# Patient Record
Sex: Female | Born: 2000 | Race: Black or African American | Hispanic: No | Marital: Single
Health system: Southern US, Community
[De-identification: ages and names within clinical notes are randomized; demographics above are authoritative.]

## PROBLEM LIST (undated history)

## (undated) DIAGNOSIS — F329 Major depressive disorder, single episode, unspecified: Secondary | ICD-10-CM

## (undated) DIAGNOSIS — F419 Anxiety disorder, unspecified: Secondary | ICD-10-CM

## (undated) DIAGNOSIS — F6089 Other specific personality disorders: Secondary | ICD-10-CM

## (undated) DIAGNOSIS — F3281 Premenstrual dysphoric disorder: Secondary | ICD-10-CM

## (undated) DIAGNOSIS — H539 Unspecified visual disturbance: Secondary | ICD-10-CM

## (undated) DIAGNOSIS — D649 Anemia, unspecified: Secondary | ICD-10-CM

---
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
## 2020-04-07 DIAGNOSIS — D500 Iron deficiency anemia secondary to blood loss (chronic): Secondary | ICD-10-CM | POA: Insufficient documentation

## 2020-04-07 DIAGNOSIS — R002 Palpitations: Secondary | ICD-10-CM | POA: Insufficient documentation

## 2020-04-21 ENCOUNTER — Other Ambulatory Visit: Payer: Self-pay | Admitting: Certified Nurse Midwife

## 2020-04-21 DIAGNOSIS — N938 Other specified abnormal uterine and vaginal bleeding: Secondary | ICD-10-CM

## 2020-05-01 ENCOUNTER — Other Ambulatory Visit: Payer: Self-pay | Admitting: Advanced Practice Midwife

## 2020-05-01 DIAGNOSIS — N938 Other specified abnormal uterine and vaginal bleeding: Secondary | ICD-10-CM

## 2020-05-07 ENCOUNTER — Ambulatory Visit
Admission: RE | Admit: 2020-05-07 | Discharge: 2020-05-07 | Disposition: A | Discharge status: Home / Self Care | Payer: Medicaid Other | Source: Ambulatory Visit | Attending: Certified Nurse Midwife | Admitting: Certified Nurse Midwife

## 2020-05-07 DIAGNOSIS — N938 Other specified abnormal uterine and vaginal bleeding: Secondary | ICD-10-CM

## 2021-04-28 ENCOUNTER — Institutional Professional Consult (permissible substitution): Payer: Self-pay | Admitting: Internal Medicine

## 2021-07-28 ENCOUNTER — Ambulatory Visit (INDEPENDENT_AMBULATORY_CARE_PROVIDER_SITE_OTHER): Payer: BC Managed Care – PPO | Admitting: Podiatry

## 2021-07-28 ENCOUNTER — Encounter: Payer: Self-pay | Admitting: Podiatry

## 2021-07-28 ENCOUNTER — Other Ambulatory Visit: Payer: Self-pay

## 2021-07-28 DIAGNOSIS — L600 Ingrowing nail: Secondary | ICD-10-CM

## 2021-07-28 NOTE — Patient Instructions (Signed)

## 2021-07-28 NOTE — Progress Notes (Signed)
Subjective:   Patient ID: Tammy Petersen, female   DOB: 20 y.o.   MRN: [id number]   HPI Patient has had chronic ingrown toenails of both feet for a long time and tries to trim them out herself and states that it is no longer effective.  States that she soaks she had the left one worked on but it still giving her trouble and the right one has been very tender recently.  Patient has been on antibiotics which helped the drainage and redness and now it is just pain.  Patient does not smoke likes to be active   Review of Systems  All other systems reviewed and are negative.      Objective:  Physical Exam Vitals and nursing note reviewed.  Constitutional:      Appearance: She is well-developed.  Pulmonary:     Effort: Pulmonary effort is normal.  Musculoskeletal:        General: Normal range of motion.  Skin:    General: Skin is warm.  Neurological:     Mental Status: She is alert.    Neurovascular status intact muscle strength found to be adequate range of motion within normal limits.  Patient is found to have incurvated hallux nails right and left medial border with quite a bit of pain right over left.  There is deformity of the nailbeds and the left 1 is more proximal but quite deformed in its nature.  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  She signed consent form and today I infiltrated each hallux 60 mg like Marcaine mixture sterile prep done and using sterile instrumentation I remove the medial borders of the hallux bilateral exposed matrix applied phenol 3 applications 30 seconds followed by alcohol lavage sterile dressing gave instructions on soaks and encouraged her to call with questions concerns and to leave dressings on 24  hours but take them off earlier if any throbbing were to occur

## 2022-02-24 ENCOUNTER — Other Ambulatory Visit: Payer: Self-pay

## 2022-02-24 ENCOUNTER — Emergency Department (HOSPITAL_COMMUNITY)
Admission: EM | Admit: 2022-02-24 | Discharge: 2022-02-24 | Disposition: A | Discharge status: Home / Self Care | Payer: BC Managed Care – PPO | Attending: Emergency Medicine | Admitting: Emergency Medicine

## 2022-02-24 ENCOUNTER — Encounter (HOSPITAL_COMMUNITY): Payer: Self-pay | Admitting: Emergency Medicine

## 2022-02-24 ENCOUNTER — Emergency Department (HOSPITAL_COMMUNITY): Payer: BC Managed Care – PPO

## 2022-02-24 DIAGNOSIS — M79644 Pain in right finger(s): Secondary | ICD-10-CM | POA: Insufficient documentation

## 2022-02-24 DIAGNOSIS — S92424A Nondisplaced fracture of distal phalanx of right great toe, initial encounter for closed fracture: Secondary | ICD-10-CM | POA: Insufficient documentation

## 2022-02-24 DIAGNOSIS — W208XXA Other cause of strike by thrown, projected or falling object, initial encounter: Secondary | ICD-10-CM | POA: Insufficient documentation

## 2022-02-24 DIAGNOSIS — S99921A Unspecified injury of right foot, initial encounter: Secondary | ICD-10-CM | POA: Diagnosis present

## 2022-02-24 NOTE — ED Notes (Signed)
Patient complaining of right foot pain on top of foot. Patient foot has no bruising but a little swelling on the area right before her big toe. ?

## 2022-02-24 NOTE — Discharge Instructions (Signed)
You were seen in the emergency department for evaluation of toe pain after a crush injury.  The x-ray showed a possible avulsion fracture.  You should use ice to the affected area and [place] shoe for comfort.  Ibuprofen for pain.  Follow-up with your regular doctor if any worsening or concerning symptoms. ?

## 2022-02-24 NOTE — ED Triage Notes (Signed)
Patient is complaining of dropping some kind of board on the top of her right foot. Patient complaining of pain and throbbing. ?

## 2022-02-24 NOTE — ED Provider Notes (Signed)
?[facility] DEPT ?Provider Note ? ? ?CSN: [id number] ?Arrival date & time: 02/24/22  2123 ? ?  ? ?History ? ?Chief Complaint  ?Patient presents with  ? Toe Injury  ? ? ?Tammy Petersen is a 21 y.o. female.  She is here with a complaint of right great toe pain after dropping a cutting board on her foot tonight.  No other injuries or complaints.  Patient states pain is 5 out of 10 worse with ambulation. ? ?The history is provided by the patient.  ?Toe Pain ?This is a new problem. The current episode started 1 to 2 hours ago. The problem occurs constantly. The problem has not changed since onset.Pertinent negatives include no chest pain, no abdominal pain, no headaches and no shortness of breath. The symptoms are aggravated by walking. Nothing relieves the symptoms. She has tried nothing for the symptoms. The treatment provided no relief.  ? ?  ? ?Home Medications ?Prior to Admission medications   ?Medication Sig Start Date End Date Taking? Authorizing Provider  ?APAP-Pamabrom-Pyrilamine (PAMPRIN MULTI-SYMPTOM PO) Take 1 tablet by mouth every 6 (six) hours as needed (menstrual cramps).    [provider]  ?cephALEXin (KEFLEX) 500 MG capsule Take 1 capsule (500 mg total) by mouth 4 (four) times daily. 08/21/16   Janne Napoleon, NP  ?ferrous sulfate 325 (65 FE) MG tablet Take 1 tablet (325 mg total) by mouth 2 (two) times daily with a meal. 05/25/15   Withrow, Elyse Jarvis, FNP  ?hydrOXYzine (ATARAX/VISTARIL) 10 MG tablet Take 1 tablet (10 mg total) by mouth 3 (three) times daily. 12/01/15   Suella Broad, FNP  ?mirtazapine (REMERON) 15 MG tablet Take 1 tablet (15 mg total) by mouth at bedtime. 12/01/15   Suella Broad, FNP  ?Multiple Vitamin (MULTIVITAMIN) tablet Take 1 tablet by mouth daily. 12/01/15   Suella Broad, FNP  ?norelgestromin-ethinyl estradiol Marilu Favre) 150-35 MCG/24HR transdermal patch Place 1 patch onto the skin once a week.    [provider]   ?sertraline (ZOLOFT) 25 MG tablet Take 3 tablets (75 mg total) by mouth daily. 12/01/15   Suella Broad, FNP  ?   ? ?Allergies    ?Zofran [ondansetron]   ? ?Review of Systems   ?Review of Systems  ?Respiratory:  Negative for shortness of breath.   ?Cardiovascular:  Negative for chest pain.  ?Gastrointestinal:  Negative for abdominal pain.  ?Neurological:  Negative for headaches.  ? ?Physical Exam ?Updated Vital Signs ?BP 137/78 (BP Location: Left Arm)   Pulse 85   Temp 98.8 ?F (37.1 ?C) (Oral)   Resp 17   Ht 5\' 4"  (1.626 m)   Wt 72.6 kg   LMP 02/22/2022   SpO2 98%   BMI 27.46 kg/m?  ?Physical Exam ?Constitutional:   ?   Appearance: She is well-developed.  ?HENT:  ?   Head: Normocephalic and atraumatic.  ?Eyes:  ?   Conjunctiva/sclera: Conjunctivae normal.  ?Musculoskeletal:     ?   General: Tenderness present. Normal range of motion.  ?   Cervical back: Neck supple.  ?   Comments: She has some tenderness and swelling of her right great toe on the medial aspect.  Nail is intact without any ecchymosis.  Sensory intact to light touch.  Midfoot and ankle nontender  ?Skin: ?   General: Skin is warm and dry.  ?   Capillary Refill: Capillary refill takes less than 2 seconds.  ?Neurological:  ?   General: No focal  deficit present.  ?   Mental Status: She is alert.  ?   GCS: GCS eye subscore is 4. GCS verbal subscore is 5. GCS motor subscore is 6.  ? ? ?ED Results / Procedures / Treatments   ?Labs ?(all labs ordered are listed, but only abnormal results are displayed) ?Labs Reviewed - No data to display ? ?EKG ?None ? ?Radiology ?DG Foot Complete Right ? ?Result Date: 02/24/2022 ?CLINICAL DATA:  Foot injury, dropped large would in cutting board on right great toe. EXAM: RIGHT FOOT COMPLETE - 3+ VIEW COMPARISON:  09/02/2011. FINDINGS: A bony density is seen along the lateral aspect of the distal phalanx of the first digit which is new from the previous exam. No dislocation. Mild degenerative changes are present  at the first metacarpal phalangeal joint. Soft tissue swelling is present at the first digit. IMPRESSION: Bony density along the lateral aspect of the distal phalanx which is new from the previous exam, possible avulsion fracture. Electronically Signed   By: Brett Fairy M.D.   On: 02/24/2022 22:13   ? ?Procedures ?Procedures  ? ? ?Medications Ordered in ED ?Medications - No data to display ? ?ED Course/ Medical Decision Making/ A&P ?  ?                        ?Medical Decision Making ?Amount and/or Complexity of Data Reviewed ?Radiology: ordered. ? ?20 year old complaining of right great toe pain after direct trauma.  X-ray showing possible avulsion fracture.  Patient clinically tender and suspicious area.  Will treat with postop shoe.  Differential includes fracture, dislocation, contusion, nailbed injury return instructions discussed ? ? ? ? ? ? ? ? ?Final Clinical Impression(s) / ED Diagnoses ?Final diagnoses:  ?Closed nondisplaced fracture of distal phalanx of right great toe, initial encounter  ? ? ?Rx / DC Orders ?ED Discharge Orders   ? ? None  ? ?  ? ? ?  ?Hayden Rasmussen, MD ?02/25/22 [phone] ? ?

## 2023-02-23 ENCOUNTER — Other Ambulatory Visit: Payer: Self-pay

## 2023-02-23 ENCOUNTER — Ambulatory Visit: Payer: BC Managed Care – PPO | Attending: Physician Assistant

## 2023-02-23 DIAGNOSIS — R2689 Other abnormalities of gait and mobility: Secondary | ICD-10-CM | POA: Insufficient documentation

## 2023-02-23 DIAGNOSIS — M6281 Muscle weakness (generalized): Secondary | ICD-10-CM | POA: Insufficient documentation

## 2023-02-23 DIAGNOSIS — G8929 Other chronic pain: Secondary | ICD-10-CM | POA: Insufficient documentation

## 2023-02-23 DIAGNOSIS — M25562 Pain in left knee: Secondary | ICD-10-CM | POA: Insufficient documentation

## 2023-02-23 NOTE — Therapy (Signed)
OUTPATIENT PHYSICAL THERAPY LOWER EXTREMITY EVALUATION   Patient Name: Tammy Petersen MRN: [id number] DOB:December 01, 2001, 22 y.o., female Today's Date: 02/23/2023  END OF SESSION:  PT End of Session - 02/23/23 1743     Visit Number 1    Number of Visits 17    Date for PT Re-Evaluation 04/20/23    Authorization Type [employer] and [place] MCD    PT Start Time 1702    PT Stop Time 1735    PT Time Calculation (min) 33 min    Activity Tolerance Patient tolerated treatment well    Behavior During Therapy WFL for tasks assessed/performed             Past Medical History:  Diagnosis Date   Anemia    Anxiety    Cluster C personality disorder ([place])    Major depression    PMDD (premenstrual dysphoric disorder)    Vision abnormalities    Pt wears glasses   History reviewed. No pertinent surgical history. Patient Active Problem List   Diagnosis Date Noted   Iron deficiency anemia due to chronic blood loss 04/07/2020   Palpitations 04/07/2020   Slow transit constipation 03/14/2018   Appetite lost    MDD (major depressive disorder) 11/13/2015   Insomnia    Chronic post-traumatic stress disorder (PTSD) 05/25/2015   MDD (major depressive disorder), recurrent, severe, with psychosis ([place]) 05/19/2015   GAD (generalized anxiety disorder) 05/19/2015    PCP: Juanda Chance  REFERRING PROVIDER: Wendall Mola, PA-C  REFERRING DIAG:  [phone] (ICD-10-CM) - Pain in right knee M25.562 (ICD-10-CM) - Pain in left knee G89.29 (ICD-10-CM) - Other chronic pain  THERAPY DIAG:  Chronic pain of left knee - Plan: PT plan of care cert/re-cert  Muscle weakness (generalized) - Plan: PT plan of care cert/re-cert  Other abnormalities of gait and mobility - Plan: PT plan of care cert/re-cert  Rationale for Evaluation and Treatment: Rehabilitation  ONSET DATE: Chronic  SUBJECTIVE:   SUBJECTIVE STATEMENT: Pt presents to PT with reports of chronic bilateral knee pain, L>R, and discomfort. Denies  trauma or MOI, notes pain has been gradually been getting worst over last few months. Denies N/T and notes pain is isolated to bilateral patellar tendons.   PERTINENT HISTORY: None PAIN:  Are you having pain?  No: NPRS scale: 0/10 Worst: 7/10 Pain location: bilateral knees, L>R Pain description: sharp Aggravating factors: stairs, prolonged sitting Relieving factors: heat, medication  PRECAUTIONS: None  WEIGHT BEARING RESTRICTIONS: No  FALLS:  Has patient fallen in last 6 months? No  LIVING ENVIRONMENT: Lives with: lives with their family Lives in: House/apartment  OCCUPATION: Works at [employer] at [place]: decrease knee pain with work and other activites  OBJECTIVE:   DIAGNOSTIC FINDINGS: N/A  PATIENT SURVEYS:  LEFS: 69/80   COGNITION: Overall cognitive status: Within functional limits for tasks assessed     SENSATION: WFL  POSTURE:  slight genu valgus  PALPATION: TTP to L knee joint line; pain with medial L patellar glide  LOWER EXTREMITY ROM:  Active ROM Right eval Left eval  Knee flexion WNL WNL  Knee extension WNL WNL  Ankle dorsiflexion    Ankle plantarflexion    Ankle inversion    Ankle eversion     (Blank rows = not tested)  LOWER EXTREMITY MMT:  MMT Right eval Left eval  Hip flexion 3+/5 3/5  Hip extension    Hip abduction 3+/5 3/5  Hip adduction  Hip internal rotation    Hip external rotation    Knee flexion 4/5 4/5  Knee extension 4+/5 4/5  Ankle dorsiflexion    Ankle plantarflexion    Ankle inversion    Ankle eversion     (Blank rows = not tested)  LOWER EXTREMITY SPECIAL TESTS:  Knee special tests: Lachman Test: negative  FUNCTIONAL TESTS:  30 Second Sit to Stand: 8 reps - 22 seconds; stopped d/t dizziness Functional Squat: quad dominant with pain  GAIT: Distance walked: 97f Assistive device utilized: None Level of assistance: Complete [place] Comments: no overt  deviations   TREATMENT: [facility] Adult PT Treatment:                                                DATE: 02/23/2023 Therapeutic Exercise: Quad sets x 5 - 5" hold Supine SLR x 5 each Supine clamshell x 10 GTB LAQ x 5 each  PATIENT EDUCATION:  Education details: eval findings, LEFS, HEP, POC Person educated: Patient Education method: Explanation, Demonstration, and Handouts Education comprehension: verbalized understanding and returned demonstration  HOME EXERCISE PROGRAM: Access Code: [email or web address] URL: [email or web address] Date: 02/23/2023 Prepared by: DOctavio Manns Exercises - Supine Quadricep Sets  - 1-2 x daily - 7 x weekly - 2 sets - 10 reps - 5 sec hold - Active Straight Leg Raise with Quad Set  - 1-2 x daily - 7 x weekly - 2-3 sets - 5 reps - Hooklying Clamshell with Resistance  - 1-2 x daily - 7 x weekly - 3 sets - 10 reps - green band hold - Seated Long Arc Quad  - 1-2 x daily - 7 x weekly - 3 sets - 10 reps - 3 sec hold  ASSESSMENT:  CLINICAL IMPRESSION: Patient is a 22y.o. F who was seen today for physical therapy evaluation and treatment for chronic knee pain. Physical findings are consistent with referring provider impression as pt demonstrates significant decrease in proximal hip and quad strength as well as palpable pain in L knee and patellar tendon. Her LEFS demonstrates slight decrease in functional ability below PLOF. She would benefit from skilled PT services working on improving strength and decreasing knee pain.    OBJECTIVE IMPAIRMENTS: decreased activity tolerance, decreased mobility, difficulty walking, decreased strength, and pain.   ACTIVITY LIMITATIONS: squatting, stairs, transfers, and locomotion level  PARTICIPATION LIMITATIONS: driving, shopping, community activity, occupation, and yard work  PERSONAL FACTORS: Time since onset of injury/illness/exacerbation are also affecting patient's functional outcome.   REHAB POTENTIAL:  Excellent  CLINICAL DECISION MAKING: Stable/uncomplicated  EVALUATION COMPLEXITY: Low   GOALS: Goals reviewed with patient? No  SHORT TERM GOALS: Target date: 03/16/2023   Pt will be compliant and knowledgeable with initial HEP for improved comfort and carryover Baseline: initial HEP given  Goal status: INITIAL  2.  Pt will self report bilateral knee pain no greater than 4/10 for improved comfort and functional ability Baseline: 7/10 at worst Goal status: INITIAL   LONG TERM GOALS: Target date: 04/20/2023   Pt will improve LEFS to no less than 78/80 as proxy for functional improvement with home ADLs and higher level community activity Baseline: 69/80 Goal status: INITIAL   2.  Pt will improve bilateral hip flex/abd and quad MMT to no less than 4+/5 for improved comfort and functional mobility and decrease knee pain Baseline: see chart Goal  status: INITIAL  3.  Pt will self report bilateral knee pain no greater than 1-2/10 for improved comfort and functional ability Baseline: 7/10 at worst Goal status: INITIAL   4.  Pt will be able to perform squat with hands to floor with no increase in knee pain for improved comfort and functional mobility Baseline: unable Goal status: INITIAL  5.  Pt will be able to ambulate up/down 10 stairs with reciprocal gait and no increase in knee pain for improved comfort with community navigation Baseline: unable Goal status: INITIAL PLAN:  PT FREQUENCY: 1-2x/week  PT DURATION: 8 weeks  PLANNED INTERVENTIONS: Therapeutic exercises, Therapeutic activity, Neuromuscular re-education, Balance training, Gait training, Patient/Family education, Self Care, Joint mobilization, Aquatic Therapy, Dry Needling, Electrical stimulation, Cryotherapy, Moist heat, Vasopneumatic device, Manual therapy, and Re-evaluation  PLAN FOR NEXT SESSION: assess HEP response, progress proximal hip and quad strength   Ward Chatters, PT 02/23/2023, 5:46 PM

## 2023-03-08 NOTE — Therapy (Unsigned)
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 Patient Active Problem List   Diagnosis Date Noted   Iron deficiency anemia due to chronic blood loss 04/07/2020   Palpitations 04/07/2020   Slow transit constipation 03/14/2018   Appetite lost    MDD (major depressive disorder) 11/13/2015   Insomnia    Chronic post-traumatic stress disorder (PTSD) 05/25/2015   MDD (major depressive disorder), recurrent, severe, with psychosis ([place]) 05/19/2015   GAD (generalized anxiety disorder) 05/19/2015    REFERRING DIAG: M25.561 (ICD-10-CM) - Pain in right knee M25.562 (ICD-10-CM) - Pain in left knee G89.29 (ICD-10-CM) - Other chronic pain  THERAPY DIAG:  Chronic pain of left knee - Plan: PT plan of care cert/re-cert   Muscle weakness (generalized) - Plan: PT plan of care cert/re-cert   Other abnormalities of gait and mobility - Plan: PT plan of care cert/re-cert  Rationale for Evaluation and Treatment Rehabilitation  PERTINENT HISTORY: None   PRECAUTIONS: None   SUBJECTIVE:                                                                                                                                                                                      SUBJECTIVE STATEMENT:  Knees bother her with prolonged positioning   PAIN:  Are you having pain? Yes: NPRS scale: 2/10 Pain location: knees L>R Pain description: ache Aggravating factors: prolonged positions Relieving factors: position changes   OBJECTIVE: (objective measures completed at  initial evaluation unless otherwise dated)   DIAGNOSTIC FINDINGS: N/A   PATIENT SURVEYS:  LEFS: 69/80    COGNITION: Overall cognitive status: Within functional limits for tasks assessed                         SENSATION: WFL   POSTURE:  slight genu valgus   PALPATION: TTP to L knee joint line; pain with medial L patellar glide   LOWER EXTREMITY ROM:   Active ROM Right eval Left eval  Knee flexion WNL WNL  Knee extension WNL WNL  Ankle dorsiflexion      Ankle plantarflexion      Ankle inversion  Ankle eversion       (Blank rows = not tested)   LOWER EXTREMITY MMT:   MMT Right eval Left eval  Hip flexion 3+/5 3/5  Hip extension      Hip abduction 3+/5 3/5  Hip adduction      Hip internal rotation      Hip external rotation      Knee flexion 4/5 4/5  Knee extension 4+/5 4/5  Ankle dorsiflexion      Ankle plantarflexion      Ankle inversion      Ankle eversion       (Blank rows = not tested)   LOWER EXTREMITY SPECIAL TESTS:  Knee special tests: Lachman Test: negative   FUNCTIONAL TESTS:  30 Second Sit to Stand: 8 reps - 22 seconds; stopped d/t dizziness Functional Squat: quad dominant with pain   GAIT: Distance walked: 94f Assistive device utilized: None Level of assistance: Complete Independence Comments: no overt deviations     TREATMENT: [facility] Adult PT Treatment:                                                DATE: 03/09/23 Therapeutic Exercise: Nustep L 2 8 min QS's B 3s 15x SLR 15x B SAQs 15x B S/L abduction 15x B Supine hip fallouts RTB 15x B, 15x unilaterally Taping applied to L knee using patellar tendinitis  technique.  Patient instructed to remove tape with any increased pain, skin irritation or in the event the tape loosens and can not be re-applied. Cautioned to never apply Leukotape directly over skin without protective cover roll in place.  PF against wall 15x Bridge 15x   [facility] Adult PT Treatment:                                                 DATE: 02/23/2023 Therapeutic Exercise: Quad sets x 5 - 5" hold Supine SLR x 5 each Supine clamshell x 10 GTB LAQ x 5 each   PATIENT EDUCATION:  Education details: eval findings, LEFS, HEP, POC Person educated: Patient Education method: Explanation, Demonstration, and Handouts Education comprehension: verbalized understanding and returned demonstration   HOME EXERCISE PROGRAM: [email or web address]: [email or web address] Date: 02/23/2023 Prepared by: DOctavio Manns  Exercises - Supine Quadricep Sets  - 1-2 x daily - 7 x weekly - 2 sets - 10 reps - 5 sec hold - Active Straight Leg Raise with Quad Set  - 1-2 x daily - 7 x weekly - 2-3 sets - 5 reps - Hooklying Clamshell with Resistance  - 1-2 x daily - 7 x weekly - 3 sets - 10 reps - green band hold - Seated Long Arc Quad  - 1-2 x daily - 7 x weekly - 3 sets - 10 reps - 3 sec hold   ASSESSMENT:   CLINICAL IMPRESSION:First f/u session and focus of treatment was quad facilitation to strengthen LE's and reinforce muscle activation strategies.  Point tenderness to L patellar tendon and McConnell tape applied.  Increased reps with HEP review and added abduction tasks to support B knees.  Incorporated PF to address ankle weakness and effect on knee mechanics.  Patient is a 22y.o. F who was seen today  for physical therapy evaluation and treatment for chronic knee pain. Physical findings are consistent with referring provider impression as pt demonstrates significant decrease in proximal hip and quad strength as well as palpable pain in L knee and patellar tendon. Her LEFS demonstrates slight decrease in functional ability below PLOF. She would benefit from skilled PT services working on improving strength and decreasing knee pain.     OBJECTIVE IMPAIRMENTS: decreased activity tolerance, decreased mobility, difficulty walking, decreased strength, and pain.    ACTIVITY LIMITATIONS: squatting, stairs, transfers,  and locomotion level   PARTICIPATION LIMITATIONS: driving, shopping, community activity, occupation, and yard work   PERSONAL FACTORS: Time since onset of injury/illness/exacerbation are also affecting patient's functional outcome.    REHAB POTENTIAL: Excellent   CLINICAL DECISION MAKING: Stable/uncomplicated   EVALUATION COMPLEXITY: Low     GOALS: Goals reviewed with patient? No   SHORT TERM GOALS: Target date: 03/16/2023   Pt will be compliant and knowledgeable with initial HEP for improved comfort and carryover Baseline: initial HEP given  Goal status: INITIAL   2.  Pt will self report bilateral knee pain no greater than 4/10 for improved comfort and functional ability Baseline: 7/10 at worst Goal status: INITIAL    LONG TERM GOALS: Target date: 04/20/2023   Pt will improve LEFS to no less than 78/80 as proxy for functional improvement with home ADLs and higher level community activity Baseline: 69/80 Goal status: INITIAL    2.  Pt will improve bilateral hip flex/abd and quad MMT to no less than 4+/5 for improved comfort and functional mobility and decrease knee pain Baseline: see chart Goal status: INITIAL   3.  Pt will self report bilateral knee pain no greater than 1-2/10 for improved comfort and functional ability Baseline: 7/10 at worst Goal status: INITIAL    4.  Pt will be able to perform squat with hands to floor with no increase in knee pain for improved comfort and functional mobility Baseline: unable Goal status: INITIAL   5.  Pt will be able to ambulate up/down 10 stairs with reciprocal gait and no increase in knee pain for improved comfort with community navigation Baseline: unable Goal status: INITIAL PLAN:   PT FREQUENCY: 1-2x/week   PT DURATION: 8 weeks   PLANNED INTERVENTIONS: Therapeutic exercises, Therapeutic activity, Neuromuscular re-education, Balance training, Gait training, Patient/Family education, Self Care, Joint mobilization, Aquatic  Therapy, Dry Needling, Electrical stimulation, Cryotherapy, Moist heat, Vasopneumatic device, Manual therapy, and Re-evaluation   PLAN FOR NEXT SESSION: assess HEP response, progress proximal hip and quad strength     Lanice Shirts, PT 03/08/2023, 7:55 AM

## 2023-03-09 ENCOUNTER — Ambulatory Visit: Payer: BC Managed Care – PPO | Attending: Physician Assistant

## 2023-03-09 DIAGNOSIS — M25562 Pain in left knee: Secondary | ICD-10-CM | POA: Insufficient documentation

## 2023-03-09 DIAGNOSIS — R2689 Other abnormalities of gait and mobility: Secondary | ICD-10-CM | POA: Diagnosis present

## 2023-03-09 DIAGNOSIS — M6281 Muscle weakness (generalized): Secondary | ICD-10-CM | POA: Diagnosis present

## 2023-03-09 DIAGNOSIS — G8929 Other chronic pain: Secondary | ICD-10-CM | POA: Insufficient documentation

## 2023-03-16 ENCOUNTER — Ambulatory Visit: Payer: BC Managed Care – PPO

## 2023-03-16 DIAGNOSIS — M6281 Muscle weakness (generalized): Secondary | ICD-10-CM

## 2023-03-16 DIAGNOSIS — G8929 Other chronic pain: Secondary | ICD-10-CM

## 2023-03-16 DIAGNOSIS — R2689 Other abnormalities of gait and mobility: Secondary | ICD-10-CM

## 2023-03-16 DIAGNOSIS — M25562 Pain in left knee: Secondary | ICD-10-CM | POA: Diagnosis not present

## 2023-03-16 NOTE — Therapy (Signed)
OUTPATIENT PHYSICAL THERAPY TREATMENT NOTE   Patient Name: Tammy Petersen MRN: [id number] DOB:2001/07/04, 22 y.o., female Today's Date: 03/16/2023  PCP: Mindi Curling, PA-C  REFERRING PROVIDER: Wendall Mola, PA-C   END OF SESSION:   PT End of Session - 03/16/23 1743     Visit Number 3    Number of Visits 17    Date for PT Re-Evaluation 04/20/23    Authorization Type [employer] and  MCD    PT Start Time 1745    PT Stop Time 1823    PT Time Calculation (min) 38 min    Activity Tolerance Patient tolerated treatment well    Behavior During Therapy WFL for tasks assessed/performed             Past Medical History:  Diagnosis Date   Anemia    Anxiety    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 No pertinent surgical history. Patient Active Problem List   Diagnosis Date Noted   Iron deficiency anemia due to chronic blood loss 04/07/2020   Palpitations 04/07/2020   Slow transit constipation 03/14/2018   Appetite lost    MDD (major depressive disorder) 11/13/2015   Insomnia    Chronic post-traumatic stress disorder (PTSD) 05/25/2015   MDD (major depressive disorder), recurrent, severe, with psychosis ([place]) 05/19/2015   GAD (generalized anxiety disorder) 05/19/2015    REFERRING DIAG: M25.561 (ICD-10-CM) - Pain in right knee M25.562 (ICD-10-CM) - Pain in left knee G89.29 (ICD-10-CM) - Other chronic pain  THERAPY DIAG:  Chronic pain of left knee - Plan: PT plan of care cert/re-cert   Muscle weakness (generalized) - Plan: PT plan of care cert/re-cert   Other abnormalities of gait and mobility - Plan: PT plan of care cert/re-cert  Rationale for Evaluation and Treatment Rehabilitation  PERTINENT HISTORY: None   PRECAUTIONS: None   SUBJECTIVE:                                                                                                                                                                                       SUBJECTIVE STATEMENT:  Felt taping of L knee was helpful last session.  Worst pain has decreased from 7 to 5   PAIN:  Are you having pain? Yes: NPRS scale: 2/10 Pain location: knees L>R Pain description: ache Aggravating factors: prolonged positions Relieving factors: position changes   OBJECTIVE: (objective measures completed at initial evaluation unless otherwise dated)   DIAGNOSTIC FINDINGS: N/A   PATIENT SURVEYS:  LEFS: 69/80  COGNITION: Overall cognitive status: Within functional limits for tasks assessed                         SENSATION: WFL   POSTURE:  slight genu valgus   PALPATION: TTP to L knee joint line; pain with medial L patellar glide   LOWER EXTREMITY ROM:   Active ROM Right eval Left eval  Knee flexion WNL WNL  Knee extension WNL WNL  Ankle dorsiflexion      Ankle plantarflexion      Ankle inversion      Ankle eversion       (Blank rows = not tested)   LOWER EXTREMITY MMT:   MMT Right eval Left eval  Hip flexion 3+/5 3/5  Hip extension      Hip abduction 3+/5 3/5  Hip adduction      Hip internal rotation      Hip external rotation      Knee flexion 4/5 4/5  Knee extension 4+/5 4/5  Ankle dorsiflexion      Ankle plantarflexion      Ankle inversion      Ankle eversion       (Blank rows = not tested)   LOWER EXTREMITY SPECIAL TESTS:  Knee special tests: Lachman Test: negative   FUNCTIONAL TESTS:  30 Second Sit to Stand: 8 reps - 22 seconds; stopped d/t dizziness Functional Squat: quad dominant with pain   GAIT: Distance walked: 38ft Assistive device utilized: None Level of assistance: Complete Independence Comments: no overt deviations     TREATMENT: [facility] Adult PT Treatment:                                                DATE: 03/16/23 Therapeutic Exercise: Nustep L 3 8 min QS's B 3s 15x SLR 15x B 2# SAQs 15x B 2# (prolonged eccentric  hold) S/L abduction 15x B 2 Supine hip fallouts GTB 15x B, 15x unilaterally S/L clams GTB 15/15 Step downs from slant board, hindfoot only, 15/15 UE support needed PF against wall 15x Bridge 15x GTB   [facility] Adult PT Treatment:                                                DATE: 03/09/23 Therapeutic Exercise: Nustep L 2 8 min QS's B 3s 15x SLR 15x B SAQs 15x B S/L abduction 15x B Supine hip fallouts RTB 15x B, 15x unilaterally Taping applied to L knee using patellar tendinitis  technique.  Patient instructed to remove tape with any increased pain, skin irritation or in the event the tape loosens and can not be re-applied. Cautioned to never apply Leukotape directly over skin without protective cover roll in place.  PF against wall 15x Bridge 15x   [facility] Adult PT Treatment:                                                DATE: 02/23/2023 Therapeutic Exercise: Quad sets x 5 - 5" hold Supine SLR x 5 each Supine clamshell x 10 GTB LAQ x  5 each   PATIENT EDUCATION:  Education details: eval findings, LEFS, HEP, POC Person educated: Patient Education method: Explanation, Demonstration, and Handouts Education comprehension: verbalized understanding and returned demonstration   HOME EXERCISE PROGRAM: Access Code: [place] URL: [email or web address] Date: 02/23/2023 Prepared by: Octavio Manns   Exercises - Supine Quadricep Sets  - 1-2 x daily - 7 x weekly - 2 sets - 10 reps - 5 sec hold - Active Straight Leg Raise with Quad Set  - 1-2 x daily - 7 x weekly - 2-3 sets - 5 reps - Hooklying Clamshell with Resistance  - 1-2 x daily - 7 x weekly - 3 sets - 10 reps - green band hold - Seated Long Arc Quad  - 1-2 x daily - 7 x weekly - 3 sets - 10 reps - 3 sec hold   ASSESSMENT:   CLINICAL IMPRESSION: No adverse effects following last session, felt L patellar tendon taping helped.  Added resistance and weight to challenge patient today.  Introduced [employer] against T-band  resistance.  Added eccentric strengthening using slant board for step down tasks  Patient is a 22 y.o. F who was seen today for physical therapy evaluation and treatment for chronic knee pain. Physical findings are consistent with referring provider impression as pt demonstrates significant decrease in proximal hip and quad strength as well as palpable pain in L knee and patellar tendon. Her LEFS demonstrates slight decrease in functional ability below PLOF. She would benefit from skilled PT services working on improving strength and decreasing knee pain.     OBJECTIVE IMPAIRMENTS: decreased activity tolerance, decreased mobility, difficulty walking, decreased strength, and pain.    ACTIVITY LIMITATIONS: squatting, stairs, transfers, and locomotion level   PARTICIPATION LIMITATIONS: driving, shopping, community activity, occupation, and yard work   PERSONAL FACTORS: Time since onset of injury/illness/exacerbation are also affecting patient's functional outcome.    REHAB POTENTIAL: Excellent   CLINICAL DECISION MAKING: Stable/uncomplicated   EVALUATION COMPLEXITY: Low     GOALS: Goals reviewed with patient? No   SHORT TERM GOALS: Target date: 03/16/2023   Pt will be compliant and knowledgeable with initial HEP for improved comfort and carryover Baseline: initial HEP given  Goal status: INITIAL   2.  Pt will self report bilateral knee pain no greater than 4/10 for improved comfort and functional ability Baseline: 7/10 at worst Goal status: INITIAL    LONG TERM GOALS: Target date: 04/20/2023   Pt will improve LEFS to no less than 78/80 as proxy for functional improvement with home ADLs and higher level community activity Baseline: 69/80 Goal status: INITIAL    2.  Pt will improve bilateral hip flex/abd and quad MMT to no less than 4+/5 for improved comfort and functional mobility and decrease knee pain Baseline: see chart Goal status: INITIAL   3.  Pt will self report bilateral  knee pain no greater than 1-2/10 for improved comfort and functional ability Baseline: 7/10 at worst Goal status: INITIAL    4.  Pt will be able to perform squat with hands to floor with no increase in knee pain for improved comfort and functional mobility Baseline: unable Goal status: INITIAL   5.  Pt will be able to ambulate up/down 10 stairs with reciprocal gait and no increase in knee pain for improved comfort with community navigation Baseline: unable Goal status: INITIAL PLAN:   PT FREQUENCY: 1-2x/week   PT DURATION: 8 weeks   PLANNED INTERVENTIONS: Therapeutic exercises, Therapeutic activity, Neuromuscular re-education, Balance training, Gait  training, Patient/Family education, Self Care, Joint mobilization, Aquatic Therapy, Dry Needling, Electrical stimulation, Cryotherapy, Moist heat, Vasopneumatic device, Manual therapy, and Re-evaluation   PLAN FOR NEXT SESSION: assess HEP response, progress proximal hip and quad strength     Lanice Shirts, PT 03/16/2023, 6:25 PM

## 2023-03-20 ENCOUNTER — Other Ambulatory Visit: Payer: Self-pay

## 2023-03-20 ENCOUNTER — Emergency Department (HOSPITAL_COMMUNITY): Payer: BC Managed Care – PPO

## 2023-03-20 ENCOUNTER — Emergency Department (HOSPITAL_COMMUNITY)
Admission: EM | Admit: 2023-03-20 | Discharge: 2023-03-20 | Disposition: A | Discharge status: Home / Self Care | Payer: BC Managed Care – PPO | Attending: Emergency Medicine | Admitting: Emergency Medicine

## 2023-03-20 ENCOUNTER — Encounter (HOSPITAL_COMMUNITY): Payer: Self-pay

## 2023-03-20 DIAGNOSIS — E876 Hypokalemia: Secondary | ICD-10-CM | POA: Diagnosis not present

## 2023-03-20 DIAGNOSIS — R590 Localized enlarged lymph nodes: Secondary | ICD-10-CM | POA: Diagnosis not present

## 2023-03-20 DIAGNOSIS — D500 Iron deficiency anemia secondary to blood loss (chronic): Secondary | ICD-10-CM | POA: Insufficient documentation

## 2023-03-20 DIAGNOSIS — M5412 Radiculopathy, cervical region: Secondary | ICD-10-CM

## 2023-03-20 DIAGNOSIS — R42 Dizziness and giddiness: Secondary | ICD-10-CM | POA: Insufficient documentation

## 2023-03-20 LAB — BASIC METABOLIC PANEL
Anion gap: 7 (ref 5–15)
BUN: 9 mg/dL (ref 6–20)
CO2: 22 mmol/L (ref 22–32)
Calcium: 8.8 mg/dL — ABNORMAL LOW (ref 8.9–10.3)
Chloride: 106 mmol/L (ref 98–111)
Creatinine, Ser: 0.61 mg/dL (ref 0.44–1.00)
GFR, Estimated: >60 mL/min (ref >60)
Glucose, Bld: 103 mg/dL — ABNORMAL HIGH (ref 70–99)
Potassium: 3.2 mmol/L — ABNORMAL LOW (ref 3.5–5.1)
Sodium: 135 mmol/L (ref 135–145)

## 2023-03-20 LAB — CBC
HCT: 32.6 % — ABNORMAL LOW (ref 36.0–46.0)
Hemoglobin: 9.6 g/dL — ABNORMAL LOW (ref 12.0–15.0)
MCH: 21.2 pg — ABNORMAL LOW (ref 26.0–34.0)
MCHC: 29.4 g/dL — ABNORMAL LOW (ref 30.0–36.0)
MCV: 72.1 fL — ABNORMAL LOW (ref 80.0–100.0)
Platelets: 360 10*3/uL (ref 150–400)
RBC: 4.52 MIL/uL (ref 3.87–5.11)
RDW: 16 % — ABNORMAL HIGH (ref 11.5–15.5)
WBC: 3.7 10*3/uL — ABNORMAL LOW (ref 4.0–10.5)
nRBC: 0 % (ref 0.0–0.2)

## 2023-03-20 MED ORDER — POTASSIUM CHLORIDE CRYS ER 20 MEQ PO TBCR
40.0000 meq | EXTENDED_RELEASE_TABLET | Freq: Once | ORAL | Status: AC
  Administered 2023-03-20: 40 meq via ORAL
  Filled 2023-03-20: qty 2

## 2023-03-20 NOTE — ED Provider Notes (Addendum)
[place] EMERGENCY DEPARTMENT AT [facility] Provider Note   CSN: [id number] Arrival date & time: 03/20/23  1810     History  Chief Complaint  Patient presents with   Dizziness    Tammy Petersen is a 22 y.o. female Pt complains of what she describes as a left arm numbness, after clarification discussed with patient, she reports more of a heaviness sensation, she denies tingling.  She reports this just from the elbow down.  Endorses history of dizzy spells, heart palpitations.  Denies any chest pain, shortness of breath at this time.    Dizziness      Home Medications Prior to Admission medications   Medication Sig Start Date End Date Taking? Authorizing Provider  APAP-Pamabrom-Pyrilamine (PAMPRIN MULTI-SYMPTOM PO) Take 1 tablet by mouth every 6 (six) hours as needed (menstrual cramps).    [provider]  cephALEXin (KEFLEX) 500 MG capsule Take 1 capsule (500 mg total) by mouth 4 (four) times daily. 08/21/16   Janne Napoleon, NP  ferrous sulfate 325 (65 FE) MG tablet Take 1 tablet (325 mg total) by mouth 2 (two) times daily with a meal. 05/25/15   Withrow, Elyse Jarvis, FNP  hydrOXYzine (ATARAX/VISTARIL) 10 MG tablet Take 1 tablet (10 mg total) by mouth 3 (three) times daily. 12/01/15   Starkes-Perry, Gayland Curry, FNP  mirtazapine (REMERON) 15 MG tablet Take 1 tablet (15 mg total) by mouth at bedtime. 12/01/15   Suella Broad, FNP  Multiple Vitamin (MULTIVITAMIN) tablet Take 1 tablet by mouth daily. 12/01/15   Starkes-Perry, Gayland Curry, FNP  norelgestromin-ethinyl estradiol Marilu Favre) 150-35 MCG/24HR transdermal patch Place 1 patch onto the skin once a week.    [provider]  sertraline (ZOLOFT) 25 MG tablet Take 3 tablets (75 mg total) by mouth daily. 12/01/15   Suella Broad, FNP      Allergies    Zofran [ondansetron]    Review of Systems   Review of Systems  Neurological:  Positive for dizziness.  All other systems reviewed and are  negative.   Physical Exam Updated Vital Signs BP 120/77   Pulse 91   Temp 98.8 F (37.1 C) (Oral)   Resp 16   Wt 72 kg   SpO2 100%   BMI 27.25 kg/m  Physical Exam Vitals and nursing note reviewed.  Constitutional:      General: She is not in acute distress.    Appearance: Normal appearance.  HENT:     Head: Normocephalic and atraumatic.  Eyes:     General:        Right eye: No discharge.        Left eye: No discharge.  Cardiovascular:     Rate and Rhythm: Normal rate and regular rhythm.     Heart sounds: No murmur heard.    No friction rub. No gallop.  Pulmonary:     Effort: Pulmonary effort is normal.     Breath sounds: Normal breath sounds.  Abdominal:     General: Bowel sounds are normal.     Palpations: Abdomen is soft.  Musculoskeletal:     Comments: Normal strength, ROM throughout bilateral upper extremities  Skin:    General: Skin is warm and dry.     Capillary Refill: Capillary refill takes less than 2 seconds.  Neurological:     Mental Status: She is alert and oriented to person, place, and time.     Comments: Cranial nerves II through XII grossly intact.  Intact finger-nose, intact  heel-to-shin.  Romberg negative, gait normal.  Alert and oriented x3.  Moves all 4 limbs spontaneously, normal coordination.  No pronator drift.  Intact strength 5 out of 5 bilateral upper and lower extremities.    Psychiatric:        Mood and Affect: Mood normal.        Behavior: Behavior normal.     ED Results / Procedures / Treatments   Labs (all labs ordered are listed, but only abnormal results are displayed) Labs Reviewed  CBC - Abnormal; Notable for the following components:      Result Value   WBC 3.7 (*)    Hemoglobin 9.6 (*)    HCT 32.6 (*)    MCV 72.1 (*)    MCH 21.2 (*)    MCHC 29.4 (*)    RDW 16.0 (*)    All other components within normal limits  BASIC METABOLIC PANEL - Abnormal; Notable for the following components:   Potassium 3.2 (*)    Glucose,  Bld 103 (*)    Calcium 8.8 (*)    All other components within normal limits    EKG EKG Interpretation  Date/Time:  Monday March 20 2023 20:52:18 EDT Ventricular Rate:  74 PR Interval:  148 QRS Duration: 79 QT Interval:  370 QTC Calculation: 411 R Axis:   60 Text Interpretation: Sinus rhythm Borderline T abnormalities, inferior leads Confirmed by Regan Lemming ([id number]) on 03/20/2023 8:59:34 PM  Radiology CT Head Wo Contrast  Result Date: 03/20/2023 CLINICAL DATA:  Left arm numbness, initial encounter EXAM: CT HEAD WITHOUT CONTRAST TECHNIQUE: Contiguous axial images were obtained from the base of the skull through the vertex without intravenous contrast. RADIATION DOSE REDUCTION: This exam was performed according to the departmental dose-optimization program which includes automated exposure control, adjustment of the mA and/or kV according to patient size and/or use of iterative reconstruction technique. COMPARISON:  None Available. FINDINGS: Brain: No evidence of acute infarction, hemorrhage, hydrocephalus, extra-axial collection or mass lesion/mass effect. Vascular: No hyperdense vessel or unexpected calcification. Skull: Normal. Negative for fracture or focal lesion. Sinuses/Orbits: No acute finding. Other: None. IMPRESSION: No acute abnormality noted. Electronically Signed   By: Inez Catalina M.D.   On: 03/20/2023 19:57    Procedures Procedures    Medications Ordered in ED Medications  potassium chloride SA (KLOR-CON M) CR tablet 40 mEq (40 mEq Oral Given 03/20/23 2156)    ED Course/ Medical Decision Making/ A&P                             Medical Decision Making Amount and/or Complexity of Data Reviewed Labs: ordered. Radiology: ordered.  Risk Prescription drug management.   This patient is a 22 y.o. female  who presents to the ED for concern of weakness, left arm heaviness.   Differential diagnoses prior to evaluation: The emergent differential diagnosis includes, but  is not limited to,  CVA, ACS, arrhythmia, vasovagal syncope, orthostatic hypotension, sepsis, hypoglycemia, electrolyte disturbance, respiratory failure, symptomatic anemia, dehydration, heat injury, polypharmacy, malignancy, anxiety/panic attack. This is not an exhaustive differential.   Past Medical History / Co-morbidities: Anxiety, depression, PTSD, iron deficiency due to chronic blood loss  Additional history: Chart reviewed. Pertinent results include: Reviewed lab work from outpatient evaluations, notably patient hemoglobin of 11.1 just 3 months ago.  Physical Exam: Physical exam performed. The pertinent findings include: Patient with no focal neurologic deficits on my exam, despite endorsing some left-sided numbness of  the arm she does not have any significant sensory discrepancies, no weakness, no other focal deficits.  She is ambulatory without any Romberg or gait abnormalities on my exam.  Lab Tests/Imaging studies: I personally interpreted labs/imaging and the pertinent results include: CBC notable for anemia, hemoglobin 9.6 with microcytic quality.  BMP notable for hypokalemia, potassium 3.2 today. CT head wo contrast shows no acute intracranial abnormality. I agree with the radiologist interpretation.  Cardiac monitoring: EKG obtained and interpreted by my attending physician which shows: Sinus rhythm, borderline T wave abnormality   Medications: I ordered medication including potassium hypokalemia.  I have reviewed the patients home medicines and have made adjustments as needed.   Disposition: After consideration of the diagnostic results and the patients response to treatment, I feel that patient is stable for discharge at this time, she may have had mild dehydration, pneumonia, hypokalemia as cause for her dizziness today, she has no focal neurologic deficits, and her left-sided arm tingling versus heaviness is resolved at my exam.  She continues to have no chest pain, is unclear  what is causing her left-sided arm symptoms but does not seem to be any acute neurologic abnormality.  I have overall low clinical suspicion for cervical radiculopathy at this time.   emergency department workup does not suggest an emergent condition requiring admission or immediate intervention beyond what has been performed at this time. The plan is: as above. The patient is safe for discharge and has been instructed to return immediately for worsening symptoms, change in symptoms or any other concerns.  Called back in the patient room because of return of some of her left-sided "numbness", again to clarify patient reports no true numbness, more than left arm heaviness sensation.  On repeat physical exam patient does have some tenderness in the left cervical paraspinous muscle, she has intact strength 5/5 bilateral upper and lower extremities, I suspect that she may be having some thoracic outlet syndrome versus cervical radiculopathy, low clinical suspicion for new MS presentation, stroke, atypical ACS presentation based on her demographics, vital signs, and overall physical exam today.  We discussed steroids for cervical radiculopathy versus ibuprofen, Tylenol, rehab and close orthopedic follow-up.  Patient wants to not try prednisone at this time.  Final Clinical Impression(s) / ED Diagnoses Final diagnoses:  Dizziness  Hypokalemia  Iron deficiency anemia due to chronic blood loss  Cervical radiculopathy    Rx / DC Orders ED Discharge Orders     None         Anselmo Pickler, PA-C 03/20/23 2149    Dorien Chihuahua 03/20/23 2210    Regan Lemming, MD 03/20/23 2214

## 2023-03-20 NOTE — ED Triage Notes (Signed)
BIBA from work with c/o dizziness and numbness in left hand.  Denies sob, cp.

## 2023-03-20 NOTE — Discharge Instructions (Addendum)
Please use Tylenol or ibuprofen for pain.  You may use 600 mg ibuprofen every 6 hours or 1000 mg of Tylenol every 6 hours.  You may choose to alternate between the 2.  This would be most effective.  Not to exceed 4 g of Tylenol within 24 hours.  Not to exceed 3200 mg ibuprofen 24 hours.  You can try the rehab exercises I provided above, recommend close follow-up with orthopedics, if you have significant worsening pain, numbness, weakness please return to the emergency department for further evaluation, otherwise it may take some time for your symptoms to resolve that the orthopedic doctor will be able to give you some additional recommendations for symptomatic improvement and workup

## 2023-03-20 NOTE — ED Provider Triage Note (Signed)
Emergency Medicine Provider Triage Evaluation Note  Tammy Petersen , a 22 y.o. female  was evaluated in triage.  Pt complains of what she describes as a left arm numbness, after clarification discussed with patient, she reports more of a heaviness sensation, she denies tingling.  She reports this just from the elbow down.  Endorses history of dizzy spells, heart palpitations.  Denies any chest pain, shortness of breath at this time.  Review of Systems  Positive: Left arm heaviness Negative: chest pain, shortness of breath  Physical Exam  BP 120/77   Pulse 91   Temp 98.8 F (37.1 C) (Oral)   Resp 16   Wt 72 kg   SpO2 100%   BMI 27.25 kg/m  Gen:   Awake, no distress   Resp:  Normal effort  MSK:   Moves extremities without difficulty  Other:    Medical Decision Making  Medically screening exam initiated at 6:59 PM.  Appropriate orders placed.  Tammy Petersen was informed that the remainder of the evaluation will be completed by another provider, this initial triage assessment does not replace that evaluation, and the importance of remaining in the ED until their evaluation is complete.  Workup initiated in triage    Dorien Chihuahua 03/20/23 1901

## 2023-03-23 ENCOUNTER — Ambulatory Visit: Payer: BC Managed Care – PPO

## 2023-03-23 DIAGNOSIS — M6281 Muscle weakness (generalized): Secondary | ICD-10-CM

## 2023-03-23 DIAGNOSIS — G8929 Other chronic pain: Secondary | ICD-10-CM

## 2023-03-23 DIAGNOSIS — R2689 Other abnormalities of gait and mobility: Secondary | ICD-10-CM

## 2023-03-23 DIAGNOSIS — M25562 Pain in left knee: Secondary | ICD-10-CM | POA: Diagnosis not present

## 2023-03-23 NOTE — Therapy (Signed)
OUTPATIENT PHYSICAL THERAPY TREATMENT NOTE   Patient Name: Tammy Petersen MRN: [id number] DOB:2001-06-24, 22 y.o., female Today's Date: 03/23/2023  PCP: Mindi Curling, PA-C  REFERRING PROVIDER: Wendall Mola, PA-C   END OF SESSION:   PT End of Session - 03/23/23 1609     Visit Number 4    Number of Visits 17    Date for PT Re-Evaluation 04/20/23    Authorization Type [employer] and [place] [employer]    PT Start Time 1610    PT Stop Time 1650    PT Time Calculation (min) 40 min    Activity Tolerance Patient tolerated treatment well    Behavior During Therapy WFL for tasks assessed/performed              Past Medical History:  Diagnosis Date   Anemia    Anxiety    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 No pertinent surgical history. 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   GAD (generalized anxiety disorder) 05/19/2015    REFERRING DIAG: M25.561 (ICD-10-CM) - Pain in right knee M25.562 (ICD-10-CM) - Pain in left knee G89.29 (ICD-10-CM) - Other chronic pain  THERAPY DIAG:  Chronic pain of left knee - Plan: PT plan of care cert/re-cert   Muscle weakness (generalized) - Plan: PT plan of care cert/re-cert   Other abnormalities of gait and mobility - Plan: PT plan of care cert/re-cert  Rationale for Evaluation and Treatment Rehabilitation  PERTINENT HISTORY: None   PRECAUTIONS: None   SUBJECTIVE:                                                                                                                                                                                       SUBJECTIVE STATEMENT:  Pt presents to PT with reports of no current pain. Has been compliant with HEP with no adverse effect.    PAIN:  Are you having pain?  Yes: NPRS scale: 0/10 Pain location: knees L>R Pain description: ache Aggravating factors: prolonged positions Relieving factors: position changes   OBJECTIVE: (objective measures completed at initial evaluation unless otherwise dated)   DIAGNOSTIC FINDINGS: N/A   PATIENT SURVEYS:  LEFS:  69/80    COGNITION: Overall cognitive status: Within functional limits for tasks assessed                         SENSATION: WFL   POSTURE:  slight genu valgus   PALPATION: TTP to L knee joint line; pain with medial L patellar glide   LOWER EXTREMITY ROM:   Active ROM Right eval Left eval  Knee flexion WNL WNL  Knee extension WNL WNL  Ankle dorsiflexion      Ankle plantarflexion      Ankle inversion      Ankle eversion       (Blank rows = not tested)   LOWER EXTREMITY MMT:   MMT Right eval Left eval  Hip flexion 3+/5 3/5  Hip extension      Hip abduction 3+/5 3/5  Hip adduction      Hip internal rotation      Hip external rotation      Knee flexion 4/5 4/5  Knee extension 4+/5 4/5  Ankle dorsiflexion      Ankle plantarflexion      Ankle inversion      Ankle eversion       (Blank rows = not tested)   LOWER EXTREMITY SPECIAL TESTS:  Knee special tests: Lachman Test: negative   FUNCTIONAL TESTS:  30 Second Sit to Stand: 8 reps - 22 seconds; stopped d/t dizziness Functional Squat: quad dominant with pain   GAIT: Distance walked: 18ft Assistive device utilized: None Level of assistance: Complete Independence Comments: no overt deviations     TREATMENT: [facility] Adult PT Treatment:                                                DATE: 03/23/23 Therapeutic Exercise: Rec bike lvl 2 x 3 min while taking subjective Quad set x 10 - 5"  hold Supine SLR 2x15 each S/L clamshell 2x10 GTB Bridge with ball 2x15 LAQ 2x10 3# Eccentric heel tap 2x10 2in  Leg press 2x10 55#  [facility] Adult PT Treatment:                                                DATE: 03/16/23 Therapeutic Exercise: Nustep L 3 8 min QS's B 3s 15x SLR 15x B 2# SAQs 15x B 2# (prolonged eccentric hold) S/L abduction 15x B 2 Supine hip fallouts GTB 15x B, 15x unilaterally S/L clams GTB 15/15 Step downs from slant board, hindfoot only, 15/15 UE support needed PF against wall 15x Bridge 15x GTB  [facility] Adult PT Treatment:                                                DATE: 03/09/23 Therapeutic Exercise: Nustep L 2 8 min QS's B 3s 15x SLR 15x B SAQs 15x B S/L abduction 15x B Supine hip fallouts RTB 15x B, 15x unilaterally Taping applied to L knee using patellar tendinitis  technique.  Patient instructed to remove tape with any increased pain, skin irritation or in the event the tape loosens and  can not be re-applied. Cautioned to never apply Leukotape directly over skin without protective cover roll in place.  PF against wall 15x Bridge 15x  [facility] Adult PT Treatment:                                                DATE: 02/23/2023 Therapeutic Exercise: Quad sets x 5 - 5" hold Supine SLR x 5 each Supine clamshell x 10 GTB LAQ x 5 each   PATIENT EDUCATION:  Education details: eval findings, LEFS, HEP, POC Person educated: Patient Education method: Explanation, Demonstration, and Handouts Education comprehension: verbalized understanding and returned demonstration   HOME EXERCISE PROGRAM: Access Code: [place] URL: [email or web address] Date: 02/23/2023 Prepared by: Octavio Manns   Exercises - Supine Quadricep Sets  - 1-2 x daily - 7 x weekly - 2 sets - 10 reps - 5 sec hold - Active Straight Leg Raise with Quad Set  - 1-2 x daily - 7 x weekly - 2-3 sets - 5 reps - Hooklying Clamshell with Resistance  - 1-2 x daily - 7 x weekly - 3 sets - 10  reps - green band hold - Seated Long Arc Quad  - 1-2 x daily - 7 x weekly - 3 sets - 10 reps - 3 sec hold   ASSESSMENT:   CLINICAL IMPRESSION: Pt was able to complete all prescribed exercises with no adverse effect or increase in pain. Therapy focused on improving proximal hip and quad strength in order to decrease pain and improve comfort. She continues to benefit from skilled PT services, will continue per POC as prescribed.   Patient is a 22 y.o. F who was seen today for physical therapy evaluation and treatment for chronic knee pain. Physical findings are consistent with referring provider impression as pt demonstrates significant decrease in proximal hip and quad strength as well as palpable pain in L knee and patellar tendon. Her LEFS demonstrates slight decrease in functional ability below PLOF. She would benefit from skilled PT services working on improving strength and decreasing knee pain.     OBJECTIVE IMPAIRMENTS: decreased activity tolerance, decreased mobility, difficulty walking, decreased strength, and pain.    ACTIVITY LIMITATIONS: squatting, stairs, transfers, and locomotion level   PARTICIPATION LIMITATIONS: driving, shopping, community activity, occupation, and yard work   PERSONAL FACTORS: Time since onset of injury/illness/exacerbation are also affecting patient's functional outcome.      GOALS: Goals reviewed with patient? No   SHORT TERM GOALS: Target date: 03/16/2023   Pt will be compliant and knowledgeable with initial HEP for improved comfort and carryover Baseline: initial HEP given  Goal status: INITIAL   2.  Pt will self report bilateral knee pain no greater than 4/10 for improved comfort and functional ability Baseline: 7/10 at worst Goal status: INITIAL    LONG TERM GOALS: Target date: 04/20/2023   Pt will improve LEFS to no less than 78/80 as proxy for functional improvement with home ADLs and higher level community activity Baseline: 69/80 Goal  status: INITIAL    2.  Pt will improve bilateral hip flex/abd and quad MMT to no less than 4+/5 for improved comfort and functional mobility and decrease knee pain Baseline: see chart Goal status: INITIAL   3.  Pt will self report bilateral knee pain no greater than 1-2/10 for improved comfort and functional ability Baseline: 7/10 at worst  Goal status: INITIAL    4.  Pt will be able to perform squat with hands to floor with no increase in knee pain for improved comfort and functional mobility Baseline: unable Goal status: INITIAL   5.  Pt will be able to ambulate up/down 10 stairs with reciprocal gait and no increase in knee pain for improved comfort with community navigation Baseline: unable Goal status: INITIAL PLAN:   PT FREQUENCY: 1-2x/week   PT DURATION: 8 weeks   PLANNED INTERVENTIONS: Therapeutic exercises, Therapeutic activity, Neuromuscular re-education, Balance training, Gait training, Patient/Family education, Self Care, Joint mobilization, Aquatic Therapy, Dry Needling, Electrical stimulation, Cryotherapy, Moist heat, Vasopneumatic device, Manual therapy, and Re-evaluation   PLAN FOR NEXT SESSION: assess HEP response, progress proximal hip and quad strength     Ward Chatters, PT 03/23/2023, 4:56 PM

## 2023-03-30 ENCOUNTER — Ambulatory Visit: Payer: BC Managed Care – PPO | Attending: Physician Assistant

## 2023-03-30 DIAGNOSIS — M6281 Muscle weakness (generalized): Secondary | ICD-10-CM | POA: Insufficient documentation

## 2023-03-30 DIAGNOSIS — G8929 Other chronic pain: Secondary | ICD-10-CM | POA: Diagnosis present

## 2023-03-30 DIAGNOSIS — M25562 Pain in left knee: Secondary | ICD-10-CM | POA: Diagnosis present

## 2023-03-30 DIAGNOSIS — R2689 Other abnormalities of gait and mobility: Secondary | ICD-10-CM | POA: Insufficient documentation

## 2023-03-30 NOTE — Therapy (Signed)
OUTPATIENT PHYSICAL THERAPY TREATMENT NOTE   Patient Name: Tammy Petersen MRN: [id number] DOB:Feb 16, 2001, 22 y.o., female Today's Date: 03/30/2023  PCP: Mindi Curling, PA-C  REFERRING PROVIDER: Wendall Mola, PA-C   END OF SESSION:   PT End of Session - 03/30/23 1613     Visit Number 5    Number of Visits 17    Date for PT Re-Evaluation 04/20/23    Authorization Type [employer] and [place] [employer]    PT Start Time 1615    PT Stop Time 1655    PT Time Calculation (min) 40 min    Activity Tolerance Patient tolerated treatment well    Behavior During Therapy WFL for tasks assessed/performed               Past Medical History:  Diagnosis Date   Anemia    Anxiety    Cluster C personality disorder    Major depression    PMDD (premenstrual dysphoric disorder)    Vision abnormalities    Pt wears glasses   History reviewed. No pertinent surgical history. Patient Active Problem List   Diagnosis Date Noted   Iron deficiency anemia due to chronic blood loss 04/07/2020   Palpitations 04/07/2020   Slow transit constipation 03/14/2018   Appetite lost    MDD (major depressive disorder) 11/13/2015   Insomnia    Chronic post-traumatic stress disorder (PTSD) 05/25/2015   MDD (major depressive disorder), recurrent, severe, with psychosis ([place]) 05/19/2015   GAD (generalized anxiety disorder) 05/19/2015    REFERRING DIAG: M25.561 (ICD-10-CM) - Pain in right knee M25.562 (ICD-10-CM) - Pain in left knee G89.29 (ICD-10-CM) - Other chronic pain  THERAPY DIAG:  Chronic pain of left knee - Plan: PT plan of care cert/re-cert   Muscle weakness (generalized) - Plan: PT plan of care cert/re-cert   Other abnormalities of gait and mobility - Plan: PT plan of care cert/re-cert  Rationale for Evaluation and Treatment Rehabilitation  PERTINENT HISTORY: None   PRECAUTIONS: None   SUBJECTIVE:                                                                                                                                                                                       SUBJECTIVE STATEMENT:  Pt presents to PT with reports of continued knee pain, had severe pain two days ago. Has been compliant with HEP.    PAIN:  Are you having pain?  Yes: NPRS scale: 0/10 Pain location: knees L>R Pain description: ache Aggravating factors: prolonged positions Relieving factors: position changes   OBJECTIVE: (objective measures completed at initial evaluation unless otherwise dated)   DIAGNOSTIC FINDINGS: N/A   PATIENT SURVEYS:  LEFS: 69/80    COGNITION: Overall cognitive status: Within functional limits for tasks assessed                         SENSATION: WFL   POSTURE:  slight genu valgus   PALPATION: TTP to L knee joint line; pain with medial L patellar glide   LOWER EXTREMITY ROM:   Active ROM Right eval Left eval  Knee flexion WNL WNL  Knee extension WNL WNL  Ankle dorsiflexion      Ankle plantarflexion      Ankle inversion      Ankle eversion       (Blank rows = not tested)   LOWER EXTREMITY MMT:   MMT Right eval Left eval  Hip flexion 3+/5 3/5  Hip extension      Hip abduction 3+/5 3/5  Hip adduction      Hip internal rotation      Hip external rotation      Knee flexion 4/5 4/5  Knee extension 4+/5 4/5  Ankle dorsiflexion      Ankle plantarflexion      Ankle inversion      Ankle eversion       (Blank rows = not tested)   LOWER EXTREMITY SPECIAL TESTS:  Knee special tests: Lachman Test: negative   FUNCTIONAL TESTS:  30 Second Sit to Stand: 11 reps  Functional Squat: quad dominant with pain   GAIT: Distance walked: 45ft Assistive device utilized: None Level of assistance: Complete Independence Comments: no overt deviations     TREATMENT: [facility] Adult PT Treatment:                                                DATE: 03/30/23 Therapeutic Exercise: Rec bike lvl 2 x 2 min while taking subjective Quad set x 10 - 5" hold Supine SLR x 10  each S/L clamshell 2x10 RTB Bridge with ball 2x15 McConnell tape to bilateral knees with lateral glide LAQ after tape x 10 each Calf stretch x 30" each  [facility] Adult PT Treatment:                                                DATE: 03/23/23 Therapeutic Exercise: Rec bike lvl 2 x 3 min while taking subjective Quad set x 10 - 5" hold Supine SLR 2x15 each S/L clamshell 2x10 GTB Bridge with ball 2x15 LAQ 2x10 3# Eccentric heel tap 2x10 2in  Leg press 2x10 55#  [facility] Adult PT Treatment:                                                DATE: 03/16/23 Therapeutic Exercise: Nustep L 3 8 min QS's B 3s 15x SLR 15x B 2# SAQs 15x B 2# (prolonged eccentric hold) S/L abduction 15x B 2 Supine hip fallouts GTB 15x B, 15x unilaterally S/L clams GTB 15/15 Step downs from slant board, hindfoot only, 15/15 UE support needed PF against wall 15x Bridge 15x GTB  [facility] Adult PT Treatment:  DATE: 03/09/23 Therapeutic Exercise: Nustep L 2 8 min QS's B 3s 15x SLR 15x B SAQs 15x B S/L abduction 15x B Supine hip fallouts RTB 15x B, 15x unilaterally Taping applied to L knee using patellar tendinitis  technique.  Patient instructed to remove tape with any increased pain, skin irritation or in the event the tape loosens and can not be re-applied. Cautioned to never apply Leukotape directly over skin without protective cover roll in place.  PF against wall 15x Bridge 15x  [facility] Adult PT Treatment:                                                DATE: 02/23/2023 Therapeutic Exercise: Quad sets x 5 - 5" hold Supine SLR x 5 each Supine clamshell x 10 GTB LAQ x 5 each   PATIENT EDUCATION:  Education details: eval findings, LEFS, HEP, POC Person educated: Patient Education method: Explanation, Demonstration, and Handouts Education comprehension: verbalized understanding and returned demonstration   HOME EXERCISE PROGRAM: Access Code: [place] URL:  [email or web address] Date: 02/23/2023 Prepared by: Octavio Manns   Exercises - Supine Quadricep Sets  - 1-2 x daily - 7 x weekly - 2 sets - 10 reps - 5 sec hold - Active Straight Leg Raise with Quad Set  - 1-2 x daily - 7 x weekly - 2-3 sets - 5 reps - Hooklying Clamshell with Resistance  - 1-2 x daily - 7 x weekly - 3 sets - 10 reps - green band hold - Seated Long Arc Quad  - 1-2 x daily - 7 x weekly - 3 sets - 10 reps - 3 sec hold   ASSESSMENT:   CLINICAL IMPRESSION: Pt was able to complete prescribed exercises, was limited by increase in pain today. Over the course of PT treatment she has met some of her LTGs, increasing her 30 Second Sit to Stand rep count and improving functional mobility. Unfortunately she continues to have a good bit of pain with work activities and stairs which then flares her knee pain for several days. Pt would benefit from continued PT services, will continue to progress as able per POC.   Patient is a 22 y.o. F who was seen today for physical therapy evaluation and treatment for chronic knee pain. Physical findings are consistent with referring provider impression as pt demonstrates significant decrease in proximal hip and quad strength as well as palpable pain in L knee and patellar tendon. Her LEFS demonstrates slight decrease in functional ability below PLOF. She would benefit from skilled PT services working on improving strength and decreasing knee pain.     OBJECTIVE IMPAIRMENTS: decreased activity tolerance, decreased mobility, difficulty walking, decreased strength, and pain.    ACTIVITY LIMITATIONS: squatting, stairs, transfers, and locomotion level   PARTICIPATION LIMITATIONS: driving, shopping, community activity, occupation, and yard work   PERSONAL FACTORS: Time since onset of injury/illness/exacerbation are also affecting patient's functional outcome.      GOALS: Goals reviewed with patient? No   SHORT TERM GOALS: Target date:  03/16/2023   Pt will be compliant and knowledgeable with initial HEP for improved comfort and carryover Baseline: initial HEP given  Goal status: MET   2.  Pt will self report bilateral knee pain no greater than 4/10 for improved comfort and functional ability Baseline: 7/10 at worst Goal status: ONGOING   LONG TERM GOALS:  Target date: 04/20/2023   Pt will improve LEFS to no less than 78/80 as proxy for functional improvement with home ADLs and higher level community activity Baseline: 69/80 03/30/2023: 69/80 Goal status: ONGOING    2.  Pt will improve bilateral hip flex/abd and quad MMT to no less than 4+/5 for improved comfort and functional mobility and decrease knee pain Baseline: see chart Goal status: INITIAL   3.  Pt will self report bilateral knee pain no greater than 1-2/10 for improved comfort and functional ability Baseline: 7/10 at worst Goal status: ONGOING   4.  Pt will be able to perform squat with hands to floor with no increase in knee pain for improved comfort and functional mobility Baseline: unable 03/30/2023: able Goal status: MET   5.  Pt will be able to ambulate up/down 10 stairs with reciprocal gait and no increase in knee pain for improved comfort with community navigation Baseline: unable Goal status: INITIAL PLAN:   PT FREQUENCY: 1-2x/week   PT DURATION: 8 weeks   PLANNED INTERVENTIONS: Therapeutic exercises, Therapeutic activity, Neuromuscular re-education, Balance training, Gait training, Patient/Family education, Self Care, Joint mobilization, Aquatic Therapy, Dry Needling, Electrical stimulation, Cryotherapy, Moist heat, Vasopneumatic device, Manual therapy, and Re-evaluation   PLAN FOR NEXT SESSION: assess HEP response, progress proximal hip and quad strength     Ward Chatters, PT 03/30/2023, 5:08 PM

## 2023-04-20 ENCOUNTER — Ambulatory Visit: Payer: BC Managed Care – PPO

## 2023-04-20 DIAGNOSIS — M25562 Pain in left knee: Secondary | ICD-10-CM | POA: Diagnosis not present

## 2023-04-20 DIAGNOSIS — M6281 Muscle weakness (generalized): Secondary | ICD-10-CM

## 2023-04-20 DIAGNOSIS — R2689 Other abnormalities of gait and mobility: Secondary | ICD-10-CM

## 2023-04-20 DIAGNOSIS — G8929 Other chronic pain: Secondary | ICD-10-CM

## 2023-04-20 NOTE — Therapy (Signed)
OUTPATIENT PHYSICAL THERAPY TREATMENT NOTE   Patient Name: Tammy Petersen MRN: [id number] DOB:24-Aug-2001, 22 y.o., female Today's Date: 04/20/2023  PCP: Jordan Hawks, PA-C  REFERRING PROVIDER: Starr Sinclair, PA-C   END OF SESSION:   PT End of Session - 04/20/23 1627     Visit Number 6    Number of Visits 17    Date for PT Re-Evaluation 04/20/23    Authorization Type [employer]BS and [place] MCD    PT Start Time 1628   patient arrived late   PT Stop Time 1658    PT Time Calculation (min) 30 min    Activity Tolerance Patient tolerated treatment well    Behavior During Therapy WFL for tasks assessed/performed               Past Medical History:  Diagnosis Date   Anemia    Anxiety    Cluster C personality disorder    Major depression    PMDD (premenstrual dysphoric disorder)    Vision abnormalities    Pt wears glasses   History reviewed. No pertinent surgical history. Patient Active Problem List   Diagnosis Date Noted   Iron deficiency anemia due to chronic blood loss 04/07/2020   Palpitations 04/07/2020   Slow transit constipation 03/14/2018   Appetite lost    MDD (major depressive disorder) 11/13/2015   Insomnia    Chronic post-traumatic stress disorder (PTSD) 05/25/2015   MDD (major depressive disorder), recurrent, severe, with psychosis ([facility]) 05/19/2015   GAD (generalized anxiety disorder) 05/19/2015    REFERRING DIAG: M25.561 (ICD-10-CM) - Pain in right knee M25.562 (ICD-10-CM) - Pain in left knee G89.29 (ICD-10-CM) - Other chronic pain  THERAPY DIAG:  Chronic pain of left knee - Plan: PT plan of care cert/re-cert   Muscle weakness (generalized) - Plan: PT plan of care cert/re-cert   Other abnormalities of gait and mobility - Plan: PT plan of care cert/re-cert  Rationale for Evaluation and Treatment Rehabilitation  PERTINENT HISTORY: None   PRECAUTIONS: None   SUBJECTIVE:                                                                                                                                                                                       SUBJECTIVE STATEMENT:  Pt presents to PT with reports of continued knee pain, had severe pain two days ago. Has been compliant with HEP.    PAIN:  Are you having pain?  Yes: NPRS scale: 0/10 Pain location: knees L>R Pain description: ache Aggravating factors: prolonged positions Relieving factors: position changes   OBJECTIVE: (objective measures completed at initial evaluation unless otherwise dated)   DIAGNOSTIC FINDINGS: N/A  PATIENT SURVEYS:  LEFS: 69/80    COGNITION: Overall cognitive status: Within functional limits for tasks assessed                         SENSATION: WFL   POSTURE:  slight genu valgus   PALPATION: TTP to L knee joint line; pain with medial L patellar glide   LOWER EXTREMITY ROM:   Active ROM Right eval Left eval  Knee flexion WNL WNL  Knee extension WNL WNL  Ankle dorsiflexion      Ankle plantarflexion      Ankle inversion      Ankle eversion       (Blank rows = not tested)   LOWER EXTREMITY MMT:   MMT Right eval Left eval  Hip flexion 3+/5 3/5  Hip extension      Hip abduction 3+/5 3/5  Hip adduction      Hip internal rotation      Hip external rotation      Knee flexion 4/5 4/5  Knee extension 4+/5 4/5  Ankle dorsiflexion      Ankle plantarflexion      Ankle inversion      Ankle eversion       (Blank rows = not tested)   LOWER EXTREMITY SPECIAL TESTS:  Knee special tests: Lachman Test: negative   FUNCTIONAL TESTS:  30 Second Sit to Stand: 11 reps  Functional Squat: quad dominant with pain   GAIT: Distance walked: 9ft Assistive device utilized: None Level of assistance: Complete Independence Comments: no overt deviations     TREATMENT: [facility] Adult PT Treatment:                                                DATE: 04/20/23 Therapeutic Exercise: Nustep L 4 8 min SLR 15x B 3# SAQs 15x B 3# Supine hip fallouts  BluTB 15x B, 15x unilaterally Single leg bridge 15/15 Bridge 15x BluTB Taping applied to B knees using medial glide technique technique.  Patient instructed to remove tape with any increased pain, skin irritation or in the event the tape loosens and can not be re-applied. Cautioned to never apply Leukotape directly over skin without protective cover roll in place.   [facility] Adult PT Treatment:                                                DATE: 03/30/23 Therapeutic Exercise: Rec bike lvl 2 x 2 min while taking subjective Quad set x 10 - 5" hold Supine SLR x 10 each S/L clamshell 2x10 RTB Bridge with ball 2x15 McConnell tape to bilateral knees with lateral glide LAQ after tape x 10 each Calf stretch x 30" each  [facility] Adult PT Treatment:                                                DATE: 03/23/23 Therapeutic Exercise: Rec bike lvl 2 x 3 min while taking subjective Quad set x 10 - 5" hold Supine SLR 2x15 each S/L clamshell 2x10 GTB Bridge with ball 2x15  LAQ 2x10 3# Eccentric heel tap 2x10 2in  Leg press 2x10 55#  [facility] Adult PT Treatment:                                                DATE: 03/16/23 Therapeutic Exercise: Nustep L 3 8 min QS's B 3s 15x SLR 15x B 2# SAQs 15x B 2# (prolonged eccentric hold) S/L abduction 15x B 2 Supine hip fallouts GTB 15x B, 15x unilaterally S/L clams GTB 15/15 Step downs from slant board, hindfoot only, 15/15 UE support needed PF against wall 15x Bridge 15x GTB  [facility] Adult PT Treatment:                                                DATE: 03/09/23 Therapeutic Exercise: Nustep L 2 8 min QS's B 3s 15x SLR 15x B SAQs 15x B S/L abduction 15x B Supine hip fallouts RTB 15x B, 15x unilaterally Taping applied to L knee using patellar tendinitis  technique.  Patient instructed to remove tape with any increased pain, skin irritation or in the event the tape loosens and can not be re-applied. Cautioned to never apply Leukotape directly over skin without  protective cover roll in place.  PF against wall 15x Bridge 15x  [facility] Adult PT Treatment:                                                DATE: 02/23/2023 Therapeutic Exercise: Quad sets x 5 - 5" hold Supine SLR x 5 each Supine clamshell x 10 GTB LAQ x 5 each   PATIENT EDUCATION:  Education details: eval findings, LEFS, HEP, POC Person educated: Patient Education method: Explanation, Demonstration, and Handouts Education comprehension: verbalized understanding and returned demonstration   HOME EXERCISE PROGRAM: Access Code: [email or web address] URL: [email or web address] Date: 02/23/2023 Prepared by: Edwinna Areola   Exercises - Supine Quadricep Sets  - 1-2 x daily - 7 x weekly - 2 sets - 10 reps - 5 sec hold - Active Straight Leg Raise with Quad Set  - 1-2 x daily - 7 x weekly - 2-3 sets - 5 reps - Hooklying Clamshell with Resistance  - 1-2 x daily - 7 x weekly - 3 sets - 10 reps - green band hold - Seated Long Arc Quad  - 1-2 x daily - 7 x weekly - 3 sets - 10 reps - 3 sec hold   ASSESSMENT:   CLINICAL IMPRESSION: Arrives with elevated B knee pain, requests to tape B knees prior to session.  Increased resistance on T-band as well as weighted exercises.  Tendency to IR B hips increasing valgus moment and exacerbating symptoms.  Todays session emphasized TKE as well as maintaining neutral hip rotation  Patient is a 22 y.o. F who was seen today for physical therapy evaluation and treatment for chronic knee pain. Physical findings are consistent with referring provider impression as pt demonstrates significant decrease in proximal hip and quad strength as well as palpable pain in L knee and patellar tendon. Her LEFS demonstrates slight decrease in functional ability below PLOF. She would benefit  from skilled PT services working on improving strength and decreasing knee pain.     OBJECTIVE IMPAIRMENTS: decreased activity tolerance, decreased mobility, difficulty walking, decreased  strength, and pain.    ACTIVITY LIMITATIONS: squatting, stairs, transfers, and locomotion level   PARTICIPATION LIMITATIONS: driving, shopping, community activity, occupation, and yard work   PERSONAL FACTORS: Time since onset of injury/illness/exacerbation are also affecting patient's functional outcome.      GOALS: Goals reviewed with patient? No   SHORT TERM GOALS: Target date: 03/16/2023   Pt will be compliant and knowledgeable with initial HEP for improved comfort and carryover Baseline: initial HEP given  Goal status: MET   2.  Pt will self report bilateral knee pain no greater than 4/10 for improved comfort and functional ability Baseline: 7/10 at worst Goal status: ONGOING   LONG TERM GOALS: Target date: 04/20/2023   Pt will improve LEFS to no less than 78/80 as proxy for functional improvement with home ADLs and higher level community activity Baseline: 69/80 03/30/2023: 69/80 Goal status: ONGOING    2.  Pt will improve bilateral hip flex/abd and quad MMT to no less than 4+/5 for improved comfort and functional mobility and decrease knee pain Baseline: see chart Goal status: INITIAL   3.  Pt will self report bilateral knee pain no greater than 1-2/10 for improved comfort and functional ability Baseline: 7/10 at worst Goal status: ONGOING   4.  Pt will be able to perform squat with hands to floor with no increase in knee pain for improved comfort and functional mobility Baseline: unable 03/30/2023: able Goal status: MET   5.  Pt will be able to ambulate up/down 10 stairs with reciprocal gait and no increase in knee pain for improved comfort with community navigation Baseline: unable Goal status: INITIAL PLAN:   PT FREQUENCY: 1-2x/week   PT DURATION: 8 weeks   PLANNED INTERVENTIONS: Therapeutic exercises, Therapeutic activity, Neuromuscular re-education, Balance training, Gait training, Patient/Family education, Self Care, Joint mobilization, Aquatic Therapy, Dry  Needling, Electrical stimulation, Cryotherapy, Moist heat, Vasopneumatic device, Manual therapy, and Re-evaluation   PLAN FOR NEXT SESSION: assess HEP response, progress proximal hip and quad strength     Hildred Laser, PT 04/20/2023, 5:02 PM

## 2023-04-27 ENCOUNTER — Ambulatory Visit: Payer: BC Managed Care – PPO

## 2023-05-04 ENCOUNTER — Ambulatory Visit: Payer: BC Managed Care – PPO | Attending: Physician Assistant

## 2023-05-04 DIAGNOSIS — M6281 Muscle weakness (generalized): Secondary | ICD-10-CM | POA: Diagnosis present

## 2023-05-04 DIAGNOSIS — M25562 Pain in left knee: Secondary | ICD-10-CM | POA: Insufficient documentation

## 2023-05-04 DIAGNOSIS — G8929 Other chronic pain: Secondary | ICD-10-CM | POA: Insufficient documentation

## 2023-05-04 NOTE — Therapy (Signed)
OUTPATIENT PHYSICAL THERAPY TREATMENT NOTE   Patient Name: Tammy Petersen MRN: [id number] DOB:09/16/2001, 22 y.o., female Today's Date: 05/04/2023  PCP: Jordan Hawks, PA-C  REFERRING PROVIDER: Starr Sinclair, PA-C   END OF SESSION:   PT End of Session - 05/04/23 1612     Visit Number 7    Number of Visits 17    Date for PT Re-Evaluation 04/20/23    Authorization Type [employer] and [place] [employer]    PT Start Time 1615    PT Stop Time 1655    PT Time Calculation (min) 40 min    Activity Tolerance Patient tolerated treatment well    Behavior During Therapy WFL for tasks assessed/performed                Past Medical History:  Diagnosis Date   Anemia    Anxiety    Cluster C personality disorder (HCC)    Major depression    PMDD (premenstrual dysphoric disorder)    Vision abnormalities    Pt wears glasses   History reviewed. No pertinent surgical history. Patient Active Problem List   Diagnosis Date Noted   Iron deficiency anemia due to chronic blood loss 04/07/2020   Palpitations 04/07/2020   Slow transit constipation 03/14/2018   Appetite lost    MDD (major depressive disorder) 11/13/2015   Insomnia    Chronic post-traumatic stress disorder (PTSD) 05/25/2015   MDD (major depressive disorder), recurrent, severe, with psychosis (HCC) 05/19/2015   GAD (generalized anxiety disorder) 05/19/2015    REFERRING DIAG: M25.561 (ICD-10-CM) - Pain in right knee M25.562 (ICD-10-CM) - Pain in left knee G89.29 (ICD-10-CM) - Other chronic pain  THERAPY DIAG:  Chronic pain of left knee - Plan: PT plan of care cert/re-cert   Muscle weakness (generalized) - Plan: PT plan of care cert/re-cert   Other abnormalities of gait and mobility - Plan: PT plan of care cert/re-cert  Rationale for Evaluation and Treatment Rehabilitation  PERTINENT HISTORY: None   PRECAUTIONS: None   SUBJECTIVE:                                                                                                                                                                                       SUBJECTIVE STATEMENT:  Pt presents to PT with reports of continued L knee pain. Has been compliant with HEP with no adverse effect noted.    PAIN:  Are you having pain?  Yes: NPRS scale: 0/10 Pain location: knees L>R Pain description: ache Aggravating factors: prolonged positions Relieving factors: position changes   OBJECTIVE: (objective measures completed at initial evaluation unless otherwise dated)   DIAGNOSTIC FINDINGS: N/A  PATIENT SURVEYS:  LEFS: 69/80    COGNITION: Overall cognitive status: Within functional limits for tasks assessed                         SENSATION: WFL   POSTURE:  slight genu valgus   PALPATION: TTP to L knee joint line; pain with medial L patellar glide   LOWER EXTREMITY ROM:   Active ROM Right eval Left eval  Knee flexion WNL WNL  Knee extension WNL WNL  Ankle dorsiflexion      Ankle plantarflexion      Ankle inversion      Ankle eversion       (Blank rows = not tested)   LOWER EXTREMITY MMT:   MMT Right eval Left eval  Hip flexion 3+/5 3/5  Hip extension      Hip abduction 3+/5 3/5  Hip adduction      Hip internal rotation      Hip external rotation      Knee flexion 4/5 4/5  Knee extension 4+/5 4/5  Ankle dorsiflexion      Ankle plantarflexion      Ankle inversion      Ankle eversion       (Blank rows = not tested)   LOWER EXTREMITY SPECIAL TESTS:  Knee special tests: Lachman Test: negative   FUNCTIONAL TESTS:  30 Second Sit to Stand: 11 reps  Functional Squat: quad dominant with pain   GAIT: Distance walked: 38ft Assistive device utilized: None Level of assistance: Complete Independence Comments: no overt deviations     TREATMENT: [facility] Adult PT Treatment:                                                DATE: 05/04/23 Therapeutic Exercise: NuStep lvl 4 LE only x 3 min while taking subjective Quad set x 10 - 5" hold Supine  SLR 2x10 each Supine clamshell 2x15 blue band Bridge with blue band 2x10 Leg press with RTB 2x10 45#  Supine hip IR stretch x 30" each McConnell tape to bilateral knees with lateral glide  [facility] Adult PT Treatment:                                                DATE: 04/20/23 Therapeutic Exercise: Nustep L 4 8 min SLR 15x B 3# SAQs 15x B 3# Supine hip fallouts BluTB 15x B, 15x unilaterally Single leg bridge 15/15 Bridge 15x BluTB Taping applied to B knees using medial glide technique technique.  Patient instructed to remove tape with any increased pain, skin irritation or in the event the tape loosens and can not be re-applied. Cautioned to never apply Leukotape directly over skin without protective cover roll in place.   [facility] Adult PT Treatment:                                                DATE: 03/30/23 Therapeutic Exercise: Rec bike lvl 2 x 2 min while taking subjective Quad set x 10 - 5" hold Supine SLR x 10 each S/L clamshell  2x10 RTB Bridge with ball 2x15 McConnell tape to bilateral knees with lateral glide LAQ after tape x 10 each Calf stretch x 30" each  [facility] Adult PT Treatment:                                                DATE: 03/23/23 Therapeutic Exercise: Rec bike lvl 2 x 3 min while taking subjective Quad set x 10 - 5" hold Supine SLR 2x15 each S/L clamshell 2x10 GTB Bridge with ball 2x15 LAQ 2x10 3# Eccentric heel tap 2x10 2in  Leg press 2x10 55#  [facility] Adult PT Treatment:                                                DATE: 03/16/23 Therapeutic Exercise: Nustep L 3 8 min QS's B 3s 15x SLR 15x B 2# SAQs 15x B 2# (prolonged eccentric hold) S/L abduction 15x B 2 Supine hip fallouts GTB 15x B, 15x unilaterally S/L clams GTB 15/15 Step downs from slant board, hindfoot only, 15/15 UE support needed PF against wall 15x Bridge 15x GTB  [facility] Adult PT Treatment:                                                DATE: 03/09/23 Therapeutic Exercise: Nustep L 2 8  min QS's B 3s 15x SLR 15x B SAQs 15x B S/L abduction 15x B Supine hip fallouts RTB 15x B, 15x unilaterally Taping applied to L knee using patellar tendinitis  technique.  Patient instructed to remove tape with any increased pain, skin irritation or in the event the tape loosens and can not be re-applied. Cautioned to never apply Leukotape directly over skin without protective cover roll in place.  PF against wall 15x Bridge 15x  [facility] Adult PT Treatment:                                                DATE: 02/23/2023 Therapeutic Exercise: Quad sets x 5 - 5" hold Supine SLR x 5 each Supine clamshell x 10 GTB LAQ x 5 each   PATIENT EDUCATION:  Education details: eval findings, LEFS, HEP, POC Person educated: Patient Education method: Explanation, Demonstration, and Handouts Education comprehension: verbalized understanding and returned demonstration   HOME EXERCISE PROGRAM: Access Code: [email or web address] URL: [email or web address] Date: 05/04/2023 Prepared by: Edwinna Areola  Exercises - Supine Quadricep Sets  - 1-2 x daily - 7 x weekly - 2 sets - 10 reps - 5 sec hold - Active Straight Leg Raise with Quad Set  - 1-2 x daily - 7 x weekly - 2-3 sets - 5 reps - Hooklying Clamshell with Resistance  - 1-2 x daily - 7 x weekly - 3 sets - 10 reps - blue band hold - Seated Long Arc Quad  - 1-2 x daily - 7 x weekly - 3 sets - 10 reps - 3 sec hold - Gastroc Stretch on Wall  - 1 x daily -  7 x weekly - 2 reps - 30 sec hold - Clamshell with Resistance  - 1 x daily - 7 x weekly - 3 sets - 10 reps - red band hold - Supine Piriformis Stretch with Leg Straight  - 1 x daily - 7 x weekly - 2 reps - 30 sec hold   ASSESSMENT:   CLINICAL IMPRESSION: Pt was able to complete all prescribed exercises with no adverse effect or increase in pain. Therapy focused on improving proximal hip and quad strength in order to decrease pain and improve comfort. HEP updated for continued strengthening. She continues  to benefit from skilled PT services, will continue per POC as prescribed.   Patient is a 22 y.o. F who was seen today for physical therapy evaluation and treatment for chronic knee pain. Physical findings are consistent with referring provider impression as pt demonstrates significant decrease in proximal hip and quad strength as well as palpable pain in L knee and patellar tendon. Her LEFS demonstrates slight decrease in functional ability below PLOF. She would benefit from skilled PT services working on improving strength and decreasing knee pain.     OBJECTIVE IMPAIRMENTS: decreased activity tolerance, decreased mobility, difficulty walking, decreased strength, and pain.    ACTIVITY LIMITATIONS: squatting, stairs, transfers, and locomotion level   PARTICIPATION LIMITATIONS: driving, shopping, community activity, occupation, and yard work   PERSONAL FACTORS: Time since onset of injury/illness/exacerbation are also affecting patient's functional outcome.      GOALS: Goals reviewed with patient? No   SHORT TERM GOALS: Target date: 03/16/2023   Pt will be compliant and knowledgeable with initial HEP for improved comfort and carryover Baseline: initial HEP given  Goal status: MET   2.  Pt will self report bilateral knee pain no greater than 4/10 for improved comfort and functional ability Baseline: 7/10 at worst Goal status: ONGOING   LONG TERM GOALS: Target date: 04/20/2023   Pt will improve LEFS to no less than 78/80 as proxy for functional improvement with home ADLs and higher level community activity Baseline: 69/80 03/30/2023: 69/80 Goal status: ONGOING    2.  Pt will improve bilateral hip flex/abd and quad MMT to no less than 4+/5 for improved comfort and functional mobility and decrease knee pain Baseline: see chart Goal status: INITIAL   3.  Pt will self report bilateral knee pain no greater than 1-2/10 for improved comfort and functional ability Baseline: 7/10 at worst Goal  status: ONGOING   4.  Pt will be able to perform squat with hands to floor with no increase in knee pain for improved comfort and functional mobility Baseline: unable 03/30/2023: able Goal status: MET   5.  Pt will be able to ambulate up/down 10 stairs with reciprocal gait and no increase in knee pain for improved comfort with community navigation Baseline: unable Goal status: INITIAL PLAN:   PT FREQUENCY: 1-2x/week   PT DURATION: 8 weeks   PLANNED INTERVENTIONS: Therapeutic exercises, Therapeutic activity, Neuromuscular re-education, Balance training, Gait training, Patient/Family education, Self Care, Joint mobilization, Aquatic Therapy, Dry Needling, Electrical stimulation, Cryotherapy, Moist heat, Vasopneumatic device, Manual therapy, and Re-evaluation   PLAN FOR NEXT SESSION: assess HEP response, progress proximal hip and quad strength     Eloy End, PT 05/04/2023, 4:57 PM

## 2023-05-11 ENCOUNTER — Ambulatory Visit: Payer: BC Managed Care – PPO

## 2023-05-12 ENCOUNTER — Ambulatory Visit: Payer: BC Managed Care – PPO

## 2023-05-31 NOTE — Therapy (Addendum)
OUTPATIENT PHYSICAL THERAPY TREATMENT NOTE/DISCHARGE SUMMARY   Patient Name: Tammy Petersen MRN: [id number] DOB:08-20-2001, 22 y.o., female Today's Date: 06/01/2023  PCP: Barbarann Ehlers  REFERRING PROVIDER: Starr Sinclair, PA-C  PHYSICAL THERAPY DISCHARGE SUMMARY  Visits from Start of Care: 8  Current functional level related to goals / functional outcomes: UTA   Remaining deficits: UTA   Education / Equipment: HEP   Patient agrees to discharge. Patient goals were partially met. Patient is being discharged due to not returning since the last visit.  END OF SESSION:   PT End of Session - 06/01/23 1756     Visit Number 8    Number of Visits 17    Date for PT Re-Evaluation 04/20/23    Authorization Type [employer] and [place] [employer]    PT Start Time 1755    PT Stop Time 1830    PT Time Calculation (min) 35 min    Activity Tolerance Patient tolerated treatment well    Behavior During Therapy WFL for tasks assessed/performed                Past Medical History:  Diagnosis Date   Anemia    Anxiety    Cluster C personality disorder (HCC)    Major depression    PMDD (premenstrual dysphoric disorder)    Vision abnormalities    Pt wears glasses   History reviewed. No pertinent surgical history. Patient Active Problem List   Diagnosis Date Noted   Iron deficiency anemia due to chronic blood loss 04/07/2020   Palpitations 04/07/2020   Slow transit constipation 03/14/2018   Appetite lost    MDD (major depressive disorder) 11/13/2015   Insomnia    Chronic post-traumatic stress disorder (PTSD) 05/25/2015   MDD (major depressive disorder), recurrent, severe, with psychosis (HCC) 05/19/2015   GAD (generalized anxiety disorder) 05/19/2015    REFERRING DIAG: M25.561 (ICD-10-CM) - Pain in right knee M25.562 (ICD-10-CM) - Pain in left knee G89.29 (ICD-10-CM) - Other chronic pain  THERAPY DIAG:  Chronic pain of left knee - Plan: PT plan of care cert/re-cert   Muscle  weakness (generalized) - Plan: PT plan of care cert/re-cert   Other abnormalities of gait and mobility - Plan: PT plan of care cert/re-cert  Rationale for Evaluation and Treatment Rehabilitation  PERTINENT HISTORY: None   PRECAUTIONS: None   SUBJECTIVE:                                                                                                                                                                                      SUBJECTIVE STATEMENT:  B knee pain minimal, 3/10.  Rates her function at 85%  PAIN:  Are you having pain?  Yes: NPRS scale: 3/10 Pain location: knees L>R Pain description: ache Aggravating factors: prolonged positions Relieving factors: position changes   OBJECTIVE: (objective measures completed at initial evaluation unless otherwise dated)   DIAGNOSTIC FINDINGS: N/A   PATIENT SURVEYS:  LEFS: 69/80    COGNITION: Overall cognitive status: Within functional limits for tasks assessed                         SENSATION: WFL   POSTURE:  slight genu valgus   PALPATION: TTP to L knee joint line; pain with medial L patellar glide   LOWER EXTREMITY ROM:   Active ROM Right eval Left eval  Knee flexion WNL WNL  Knee extension WNL WNL  Ankle dorsiflexion      Ankle plantarflexion      Ankle inversion      Ankle eversion       (Blank rows = not tested)   LOWER EXTREMITY MMT:   MMT Right eval Left eval R 06/01/23 L 06/01/23  Hip flexion 3+/5 3/5 4- 4  Hip extension     4- 4  Hip abduction 3+/5 3/5 4- 4  Hip adduction        Hip internal rotation        Hip external rotation        Knee flexion 4/5 4/5 4 4   Knee extension 4+/5 4/5 4+ 4  Ankle dorsiflexion        Ankle plantarflexion        Ankle inversion        Ankle eversion         (Blank rows = not tested)   LOWER EXTREMITY SPECIAL TESTS:  Knee special tests: Lachman Test: negative   FUNCTIONAL TESTS:  30 Second Sit to Stand: 11 reps; 06/01/23 15 arms crossed Functional  Squat: quad dominant with pain   GAIT: Distance walked: 81ft Assistive device utilized: None Level of assistance: Complete Independence Comments: no overt deviations     TREATMENT: [facility] Adult PT Treatment:                                                DATE: 06/01/23 Therapeutic Exercise: NuStep lvl 4 8 min Single leg bridge 15/15 Supine clamshell x15 black band B, 15/15 unilaterally Bridge with blue band 15x Bridge with ball squeeze 15x 30s chair stand test 15 reps arms crossed Re-aasessment of goals and objective measures/strength   [facility] Adult PT Treatment:                                                DATE: 05/04/23 Therapeutic Exercise: NuStep lvl 4 LE only x 3 min while taking subjective Quad set x 10 - 5" hold Supine SLR 2x10 each Supine clamshell 2x15 blue band Bridge with blue band 2x10 Leg press with RTB 2x10 45#  Supine hip IR stretch x 30" each McConnell tape to bilateral knees with lateral glide  [facility] Adult PT Treatment:  DATE: 04/20/23 Therapeutic Exercise: Nustep L 4 8 min SLR 15x B 3# SAQs 15x B 3# Supine hip fallouts BluTB 15x B, 15x unilaterally Single leg bridge 15/15 Bridge 15x BluTB Taping applied to B knees using medial glide technique technique.  Patient instructed to remove tape with any increased pain, skin irritation or in the event the tape loosens and can not be re-applied. Cautioned to never apply Leukotape directly over skin without protective cover roll in place.   [facility] Adult PT Treatment:                                                DATE: 03/30/23 Therapeutic Exercise: Rec bike lvl 2 x 2 min while taking subjective Quad set x 10 - 5" hold Supine SLR x 10 each S/L clamshell 2x10 RTB Bridge with ball 2x15 McConnell tape to bilateral knees with lateral glide LAQ after tape x 10 each Calf stretch x 30" each  [facility] Adult PT Treatment:                                                DATE:  03/23/23 Therapeutic Exercise: Rec bike lvl 2 x 3 min while taking subjective Quad set x 10 - 5" hold Supine SLR 2x15 each S/L clamshell 2x10 GTB Bridge with ball 2x15 LAQ 2x10 3# Eccentric heel tap 2x10 2in  Leg press 2x10 55#  [facility] Adult PT Treatment:                                                DATE: 03/16/23 Therapeutic Exercise: Nustep L 3 8 min QS's B 3s 15x SLR 15x B 2# SAQs 15x B 2# (prolonged eccentric hold) S/L abduction 15x B 2 Supine hip fallouts GTB 15x B, 15x unilaterally S/L clams GTB 15/15 Step downs from slant board, hindfoot only, 15/15 UE support needed PF against wall 15x Bridge 15x GTB  [facility] Adult PT Treatment:                                                DATE: 03/09/23 Therapeutic Exercise: Nustep L 2 8 min QS's B 3s 15x SLR 15x B SAQs 15x B S/L abduction 15x B Supine hip fallouts RTB 15x B, 15x unilaterally Taping applied to L knee using patellar tendinitis  technique.  Patient instructed to remove tape with any increased pain, skin irritation or in the event the tape loosens and can not be re-applied. Cautioned to never apply Leukotape directly over skin without protective cover roll in place.  PF against wall 15x Bridge 15x  [facility] Adult PT Treatment:                                                DATE: 02/23/2023 Therapeutic Exercise: Quad sets x 5 - 5" hold Supine SLR x  5 each Supine clamshell x 10 GTB LAQ x 5 each   PATIENT EDUCATION:  Education details: eval findings, LEFS, HEP, POC Person educated: Patient Education method: Explanation, Demonstration, and Handouts Education comprehension: verbalized understanding and returned demonstration   HOME EXERCISE PROGRAM: Access Code: [email or web address] URL: [email or web address] Date: 05/04/2023 Prepared by: Edwinna Areola  Exercises - Supine Quadricep Sets  - 1-2 x daily - 7 x weekly - 2 sets - 10 reps - 5 sec hold - Active Straight Leg Raise with Quad Set  - 1-2 x daily - 7 x weekly -  2-3 sets - 5 reps - Hooklying Clamshell with Resistance  - 1-2 x daily - 7 x weekly - 3 sets - 10 reps - blue band hold - Seated Long Arc Quad  - 1-2 x daily - 7 x weekly - 3 sets - 10 reps - 3 sec hold - Gastroc Stretch on Wall  - 1 x daily - 7 x weekly - 2 reps - 30 sec hold - Clamshell with Resistance  - 1 x daily - 7 x weekly - 3 sets - 10 reps - red band hold - Supine Piriformis Stretch with Leg Straight  - 1 x daily - 7 x weekly - 2 reps - 30 sec hold   ASSESSMENT:   CLINICAL IMPRESSION: All STG addressed and met.  Progress made towards LTGs but some remain ongoing including symptoms free stairs, pain level and strength.  Instructed to consider patellar stabilizing braces and handouts issued.  Patient feels close to independent management and will return in 2-3 weeks to assess success of self management.  Patient is a 22 y.o. F who was seen today for physical therapy evaluation and treatment for chronic knee pain. Physical findings are consistent with referring provider impression as pt demonstrates significant decrease in proximal hip and quad strength as well as palpable pain in L knee and patellar tendon. Her LEFS demonstrates slight decrease in functional ability below PLOF. She would benefit from skilled PT services working on improving strength and decreasing knee pain.     OBJECTIVE IMPAIRMENTS: decreased activity tolerance, decreased mobility, difficulty walking, decreased strength, and pain.    ACTIVITY LIMITATIONS: squatting, stairs, transfers, and locomotion level   PARTICIPATION LIMITATIONS: driving, shopping, community activity, occupation, and yard work   PERSONAL FACTORS: Time since onset of injury/illness/exacerbation are also affecting patient's functional outcome.      GOALS: Goals reviewed with patient? No   SHORT TERM GOALS: Target date: 03/16/2023   Pt will be compliant and knowledgeable with initial HEP for improved comfort and carryover Baseline: initial HEP  given  Goal status: MET   2.  Pt will self report bilateral knee pain no greater than 4/10 for improved comfort and functional ability Baseline: 7/10 at worst; 06/01/23 3/10 Goal status: Met   LONG TERM GOALS: Target date: 06/20/2023   Pt will improve LEFS to no less than 78/80 as proxy for functional improvement with home ADLs and higher level community activity Baseline: 69/80 03/30/2023: 69/80 06/01/23: 68/80 Goal status: Partially met   2.  Pt will improve bilateral hip flex/abd and quad MMT to no less than 4+/5 for improved comfort and functional mobility and decrease knee pain Baseline: see chart Goal status: Progressing   3.  Pt will self report bilateral knee pain no greater than 1-2/10 for improved comfort and functional ability Baseline: 7/10 at worst; 06/01/23 3/10 Goal status: Progressing   4.  Pt will be able to perform squat with  hands to floor with no increase in knee pain for improved comfort and functional mobility Baseline: unable 03/30/2023: able Goal status: MET   5.  Pt will be able to ambulate up/down 10 stairs with reciprocal gait and no increase in knee pain for improved comfort with community navigation Baseline: Able to negotiate steps with minimal discmfort Goal status: Progressing PLAN:   PT FREQUENCY: 1-2x/week   PT DURATION: 8 weeks   PLANNED INTERVENTIONS: Therapeutic exercises, Therapeutic activity, Neuromuscular re-education, Balance training, Gait training, Patient/Family education, Self Care, Joint mobilization, Aquatic Therapy, Dry Needling, Electrical stimulation, Cryotherapy, Moist heat, Vasopneumatic device, Manual therapy, and Re-evaluation   PLAN FOR NEXT SESSION: assess HEP response, progress proximal hip and quad strength     Hildred Laser, PT 06/01/2023, 6:36 PM

## 2023-06-01 ENCOUNTER — Ambulatory Visit: Payer: BC Managed Care – PPO | Attending: Physician Assistant

## 2023-06-01 DIAGNOSIS — M25562 Pain in left knee: Secondary | ICD-10-CM | POA: Diagnosis present

## 2023-06-01 DIAGNOSIS — R2689 Other abnormalities of gait and mobility: Secondary | ICD-10-CM | POA: Insufficient documentation

## 2023-06-01 DIAGNOSIS — G8929 Other chronic pain: Secondary | ICD-10-CM | POA: Insufficient documentation

## 2023-06-01 DIAGNOSIS — M6281 Muscle weakness (generalized): Secondary | ICD-10-CM | POA: Insufficient documentation

## 2023-06-20 NOTE — Therapy (Deleted)
OUTPATIENT PHYSICAL THERAPY TREATMENT NOTE   Patient Name: Tammy Petersen MRN: [id number] DOB:[birth year], 22 y.o., female Today's Date: 06/01/2023  PCP: Jordan Hawks, PA-C  REFERRING PROVIDER: Starr Sinclair, PA-C   END OF SESSION:   PT End of Session - 06/01/23 1756     Visit Number 8    Number of Visits 17    Date for PT Re-Evaluation 04/20/23    Authorization Type [employer] and [place] [employer]    PT Start Time 1755    PT Stop Time 1830    PT Time Calculation (min) 35 min    Activity Tolerance Patient tolerated treatment well    Behavior During Therapy WFL for tasks assessed/performed                Past Medical History:  Diagnosis Date   Anemia    Anxiety    Cluster C personality disorder (HCC)    Major depression    PMDD (premenstrual dysphoric disorder)    Vision abnormalities    Pt wears glasses   History reviewed. No pertinent surgical history. Patient Active Problem List   Diagnosis Date Noted   Iron deficiency anemia due to chronic blood loss 04/07/2020   Palpitations 04/07/2020   Slow transit constipation 03/14/2018   Appetite lost    MDD (major depressive disorder) 11/13/2015   Insomnia    Chronic post-traumatic stress disorder (PTSD) 05/25/2015   MDD (major depressive disorder), recurrent, severe, with psychosis (HCC) 05/19/2015   GAD (generalized anxiety disorder) 05/19/2015    REFERRING DIAG: M25.561 (ICD-10-CM) - Pain in right knee M25.562 (ICD-10-CM) - Pain in left knee G89.29 (ICD-10-CM) - Other chronic pain  THERAPY DIAG:  Chronic pain of left knee - Plan: PT plan of care cert/re-cert   Muscle weakness (generalized) - Plan: PT plan of care cert/re-cert   Other abnormalities of gait and mobility - Plan: PT plan of care cert/re-cert  Rationale for Evaluation and Treatment Rehabilitation  PERTINENT HISTORY: None   PRECAUTIONS: None   SUBJECTIVE:                                                                                                                                                                                       SUBJECTIVE STATEMENT:  B knee pain minimal, 3/10.  Rates her function at 85%   PAIN:  Are you having pain?  Yes: NPRS scale: 3/10 Pain location: knees L>R Pain description: ache Aggravating factors: prolonged positions Relieving factors: position changes   OBJECTIVE: (objective measures completed at initial evaluation unless otherwise dated)   DIAGNOSTIC FINDINGS: N/A   PATIENT SURVEYS:  LEFS: 69/80    COGNITION: Overall cognitive  status: Within functional limits for tasks assessed                         SENSATION: WFL   POSTURE:  slight genu valgus   PALPATION: TTP to L knee joint line; pain with medial L patellar glide   LOWER EXTREMITY ROM:   Active ROM Right eval Left eval  Knee flexion WNL WNL  Knee extension WNL WNL  Ankle dorsiflexion      Ankle plantarflexion      Ankle inversion      Ankle eversion       (Blank rows = not tested)   LOWER EXTREMITY MMT:   MMT Right eval Left eval R 06/01/23 L 06/01/23  Hip flexion 3+/5 3/5 4- 4  Hip extension     4- 4  Hip abduction 3+/5 3/5 4- 4  Hip adduction        Hip internal rotation        Hip external rotation        Knee flexion 4/5 4/5 4 4   Knee extension 4+/5 4/5 4+ 4  Ankle dorsiflexion        Ankle plantarflexion        Ankle inversion        Ankle eversion         (Blank rows = not tested)   LOWER EXTREMITY SPECIAL TESTS:  Knee special tests: Lachman Test: negative   FUNCTIONAL TESTS:  30 Second Sit to Stand: 11 reps; 06/01/23 15 arms crossed Functional Squat: quad dominant with pain   GAIT: Distance walked: 37ft Assistive device utilized: None Level of assistance: Complete Independence Comments: no overt deviations     TREATMENT: [facility] Adult PT Treatment:                                                DATE: 06/01/23 Therapeutic Exercise: NuStep lvl 4 8 min Single leg bridge 15/15 Supine clamshell x15  black band B, 15/15 unilaterally Bridge with blue band 15x Bridge with ball squeeze 15x 30s chair stand test 15 reps arms crossed Re-aasessment of goals and objective measures/strength   [facility] Adult PT Treatment:                                                DATE: 05/04/23 Therapeutic Exercise: NuStep lvl 4 LE only x 3 min while taking subjective Quad set x 10 - 5" hold Supine SLR 2x10 each Supine clamshell 2x15 blue band Bridge with blue band 2x10 Leg press with RTB 2x10 45#  Supine hip IR stretch x 30" each McConnell tape to bilateral knees with lateral glide  [facility] Adult PT Treatment:                                                DATE: 04/20/23 Therapeutic Exercise: Nustep L 4 8 min SLR 15x B 3# SAQs 15x B 3# Supine hip fallouts BluTB 15x B, 15x unilaterally Single leg bridge 15/15 Bridge 15x BluTB Taping applied to B knees using medial glide technique technique.  Patient instructed to remove tape with any increased pain, skin irritation or in the event the tape loosens and can not be re-applied. Cautioned to never apply Leukotape directly over skin without protective cover roll in place.   [facility] Adult PT Treatment:                                                DATE: 03/30/23 Therapeutic Exercise: Rec bike lvl 2 x 2 min while taking subjective Quad set x 10 - 5" hold Supine SLR x 10 each S/L clamshell 2x10 RTB Bridge with ball 2x15 McConnell tape to bilateral knees with lateral glide LAQ after tape x 10 each Calf stretch x 30" each  [facility] Adult PT Treatment:                                                DATE: 03/23/23 Therapeutic Exercise: Rec bike lvl 2 x 3 min while taking subjective Quad set x 10 - 5" hold Supine SLR 2x15 each S/L clamshell 2x10 GTB Bridge with ball 2x15 LAQ 2x10 3# Eccentric heel tap 2x10 2in  Leg press 2x10 55#  [facility] Adult PT Treatment:                                                DATE: 03/16/23 Therapeutic Exercise: Nustep L 3 8 min QS's B 3s  15x SLR 15x B 2# SAQs 15x B 2# (prolonged eccentric hold) S/L abduction 15x B 2 Supine hip fallouts GTB 15x B, 15x unilaterally S/L clams GTB 15/15 Step downs from slant board, hindfoot only, 15/15 UE support needed PF against wall 15x Bridge 15x GTB  [facility] Adult PT Treatment:                                                DATE: 03/09/23 Therapeutic Exercise: Nustep L 2 8 min QS's B 3s 15x SLR 15x B SAQs 15x B S/L abduction 15x B Supine hip fallouts RTB 15x B, 15x unilaterally Taping applied to L knee using patellar tendinitis  technique.  Patient instructed to remove tape with any increased pain, skin irritation or in the event the tape loosens and can not be re-applied. Cautioned to never apply Leukotape directly over skin without protective cover roll in place.  PF against wall 15x Bridge 15x  [facility] Adult PT Treatment:                                                DATE: 02/23/2023 Therapeutic Exercise: Quad sets x 5 - 5" hold Supine SLR x 5 each Supine clamshell x 10 GTB LAQ x 5 each   PATIENT EDUCATION:  Education details: eval findings, LEFS, HEP, POC Person educated: Patient Education method: Explanation, Demonstration, and Handouts Education comprehension: verbalized understanding and returned demonstration   HOME EXERCISE  PROGRAM: Access Code: [email or web address] URL: [email or web address] Date: 05/04/2023 Prepared by: Edwinna Areola  Exercises - Supine Quadricep Sets  - 1-2 x daily - 7 x weekly - 2 sets - 10 reps - 5 sec hold - Active Straight Leg Raise with Quad Set  - 1-2 x daily - 7 x weekly - 2-3 sets - 5 reps - Hooklying Clamshell with Resistance  - 1-2 x daily - 7 x weekly - 3 sets - 10 reps - blue band hold - Seated Long Arc Quad  - 1-2 x daily - 7 x weekly - 3 sets - 10 reps - 3 sec hold - Gastroc Stretch on Wall  - 1 x daily - 7 x weekly - 2 reps - 30 sec hold - Clamshell with Resistance  - 1 x daily - 7 x weekly - 3 sets - 10 reps - red band hold -  Supine Piriformis Stretch with Leg Straight  - 1 x daily - 7 x weekly - 2 reps - 30 sec hold   ASSESSMENT:   CLINICAL IMPRESSION: All STG addressed and met.  Progress made towards LTGs but some remain ongoing including symptoms free stairs, pain level and strength.  Instructed to consider patellar stabilizing braces and handouts issued.  Patient feels close to independent management and will return in 2-3 weeks to assess success of self management.  Patient is a 22 y.o. F who was seen today for physical therapy evaluation and treatment for chronic knee pain. Physical findings are consistent with referring provider impression as pt demonstrates significant decrease in proximal hip and quad strength as well as palpable pain in L knee and patellar tendon. Her LEFS demonstrates slight decrease in functional ability below PLOF. She would benefit from skilled PT services working on improving strength and decreasing knee pain.     OBJECTIVE IMPAIRMENTS: decreased activity tolerance, decreased mobility, difficulty walking, decreased strength, and pain.    ACTIVITY LIMITATIONS: squatting, stairs, transfers, and locomotion level   PARTICIPATION LIMITATIONS: driving, shopping, community activity, occupation, and yard work   PERSONAL FACTORS: Time since onset of injury/illness/exacerbation are also affecting patient's functional outcome.      GOALS: Goals reviewed with patient? No   SHORT TERM GOALS: Target date: 03/16/2023   Pt will be compliant and knowledgeable with initial HEP for improved comfort and carryover Baseline: initial HEP given  Goal status: MET   2.  Pt will self report bilateral knee pain no greater than 4/10 for improved comfort and functional ability Baseline: 7/10 at worst; 06/01/23 3/10 Goal status: Met   LONG TERM GOALS: Target date: 06/20/2023   Pt will improve LEFS to no less than 78/80 as proxy for functional improvement with home ADLs and higher level community  activity Baseline: 69/80 03/30/2023: 69/80 06/01/23: 68/80 Goal status: Partially met   2.  Pt will improve bilateral hip flex/abd and quad MMT to no less than 4+/5 for improved comfort and functional mobility and decrease knee pain Baseline: see chart Goal status: Progressing   3.  Pt will self report bilateral knee pain no greater than 1-2/10 for improved comfort and functional ability Baseline: 7/10 at worst; 06/01/23 3/10 Goal status: Progressing   4.  Pt will be able to perform squat with hands to floor with no increase in knee pain for improved comfort and functional mobility Baseline: unable 03/30/2023: able Goal status: MET   5.  Pt will be able to ambulate up/down 10 stairs with reciprocal gait and no increase in knee  pain for improved comfort with community navigation Baseline: Able to negotiate steps with minimal discmfort Goal status: Progressing PLAN:   PT FREQUENCY: 1-2x/week   PT DURATION: 8 weeks   PLANNED INTERVENTIONS: Therapeutic exercises, Therapeutic activity, Neuromuscular re-education, Balance training, Gait training, Patient/Family education, Self Care, Joint mobilization, Aquatic Therapy, Dry Needling, Electrical stimulation, Cryotherapy, Moist heat, Vasopneumatic device, Manual therapy, and Re-evaluation   PLAN FOR NEXT SESSION: assess HEP response, progress proximal hip and quad strength     Hildred Laser, PT 06/01/2023, 6:36 PM

## 2023-06-20 NOTE — Addendum Note (Signed)
Addended by: Hildred Laser on: 06/20/2023 05:36 PM   Modules accepted: Orders

## 2023-06-22 ENCOUNTER — Ambulatory Visit: Payer: BC Managed Care – PPO
